# Patient Record
Sex: Male | Born: 1975 | Race: Black or African American | Hispanic: No | Marital: Single | Smoking: Former smoker
Health system: Southern US, Community
[De-identification: ages and names within clinical notes are randomized; demographics above are authoritative.]

## PROBLEM LIST (undated history)

## (undated) ENCOUNTER — Emergency Department (HOSPITAL_COMMUNITY): Admission: EM | Discharge status: Against Medical Advice | Payer: Self-pay

## (undated) DIAGNOSIS — F319 Bipolar disorder, unspecified: Secondary | ICD-10-CM

## (undated) DIAGNOSIS — Z789 Other specified health status: Secondary | ICD-10-CM

## (undated) DIAGNOSIS — F4310 Post-traumatic stress disorder, unspecified: Secondary | ICD-10-CM

## (undated) HISTORY — PX: NO PAST SURGERIES: SHX2092

---

## 2004-04-19 ENCOUNTER — Emergency Department (HOSPITAL_COMMUNITY): Admission: EM | Admit: 2004-04-19 | Discharge: 2004-04-19 | Payer: Self-pay | Admitting: Emergency Medicine

## 2005-01-28 ENCOUNTER — Emergency Department (HOSPITAL_COMMUNITY): Admission: EM | Admit: 2005-01-28 | Discharge: 2005-01-28 | Payer: Self-pay | Admitting: Family Medicine

## 2011-11-12 ENCOUNTER — Emergency Department (HOSPITAL_COMMUNITY)
Admission: EM | Admit: 2011-11-12 | Discharge: 2011-11-12 | Disposition: A | Discharge status: Home / Self Care | Payer: Self-pay | Attending: Emergency Medicine | Admitting: Emergency Medicine

## 2011-11-12 ENCOUNTER — Encounter (HOSPITAL_COMMUNITY): Payer: Self-pay | Admitting: *Deleted

## 2011-11-12 ENCOUNTER — Emergency Department (HOSPITAL_COMMUNITY): Payer: Self-pay

## 2011-11-12 DIAGNOSIS — W2209XA Striking against other stationary object, initial encounter: Secondary | ICD-10-CM | POA: Insufficient documentation

## 2011-11-12 DIAGNOSIS — S62309A Unspecified fracture of unspecified metacarpal bone, initial encounter for closed fracture: Secondary | ICD-10-CM | POA: Insufficient documentation

## 2011-11-12 DIAGNOSIS — F17200 Nicotine dependence, unspecified, uncomplicated: Secondary | ICD-10-CM | POA: Insufficient documentation

## 2011-11-12 DIAGNOSIS — S62339A Displaced fracture of neck of unspecified metacarpal bone, initial encounter for closed fracture: Secondary | ICD-10-CM

## 2011-11-12 MED ORDER — IBUPROFEN 400 MG PO TABS
800.0000 mg | ORAL_TABLET | Freq: Once | ORAL | Status: AC
  Administered 2011-11-12: 800 mg via ORAL
  Filled 2011-11-12: qty 2

## 2011-11-12 MED ORDER — HYDROCODONE-ACETAMINOPHEN 5-325 MG PO TABS
2.0000 | ORAL_TABLET | Freq: Once | ORAL | Status: AC
  Administered 2011-11-12: 2 via ORAL
  Filled 2011-11-12: qty 2

## 2011-11-12 MED ORDER — HYDROCODONE-ACETAMINOPHEN 5-325 MG PO TABS: ORAL_TABLET | ORAL | Status: AC

## 2011-11-12 NOTE — ED Notes (Signed)
Reports punching object on Friday, having pain and swelling to right hand.

## 2011-11-12 NOTE — ED Notes (Signed)
Pt presents to department for evaluation of R hand pain and swelling. States pain started on Friday after punching an object with his hand. Swelling and redness noted to dorsal area of hand. Pulses present. Able to wiggle digits. Sensation intact. 5/10 pain at the time. Pt is alert and oriented x4. No signs of acute distress noted.

## 2011-11-12 NOTE — Progress Notes (Signed)
Orthopedic Tech Progress Note Patient Details:  Dylan [name] April 15, 1976 [id number]  Ortho Devices Type of Ortho Device: Ulna gutter splint;Arm foam sling Ortho Device/Splint Interventions: Application   Cammer, Mickie Bail 11/12/2011, 3:12 PM

## 2011-11-12 NOTE — Discharge Instructions (Signed)
Boxer's Fracture You have a break (fracture) of the fifth metacarpal bone. This is commonly called a boxer's fracture. This is the bone in the hand where the little finger attaches. The fracture is in the end of that bone, closest to the little finger. It is usually caused when you hit an object with a clenched fist. Often, the knuckle is pushed down by the impact. Sometimes, the fracture rotates out of position. A boxer's fracture will usually heal within 6 weeks, if it is treated properly and protected from re-injury. Surgery is sometimes needed. A cast, splint, or bulky hand dressing may be used to protect and immobilize a boxer's fracture. Do not remove this device or dressing until your caregiver approves. Keep your hand elevated, and apply ice packs for 15 to 20 minutes every 2 hours, for the first 2 days. Elevation and ice help reduce swelling and relieve pain. See your caregiver, or an orthopedic specialist, for follow-up care within the next 10 days. This is to make sure your fracture is healing properly. Document Released: 05/01/2005 Document Revised: 04/20/2011 Document Reviewed: 10/19/2006 [facility] Patient Information 2012 [place], [place].     Narcotic and benzodiazepine use may cause drowsiness, slowed breathing or dependence.  Please use with caution and do not drive, operate machinery or watch young children alone while taking them.  Taking combinations of these medications or drinking alcohol will potentiate these effects.

## 2011-11-12 NOTE — ED Provider Notes (Signed)
History   This chart was scribed for Dylan [name]. Venora Kautzman, MD scribed by Magnus Sinning. The patient was seen in room TR06C/TR06C seen at 13:49   CSN: [id number]  Arrival date & time 11/12/11  1314   None     Chief Complaint  Patient presents with  . Hand Injury    (Consider location/radiation/quality/duration/timing/severity/associated sxs/prior treatment) HPI DARVIS CROFT is a 36 y.o. male who presents to the Emergency Department complaining of constant moderate right hand pain with associated swelling, onset 2 days after punching a refrigerator. States he has applied ice with no relief. Denies hx of previous fx's or any visits with orthopedic specialist. Pt is right handed.  No bleeding, laceration or abrasions.   History reviewed. No pertinent past medical history.  History reviewed. No pertinent past surgical history.  History reviewed. No pertinent family history.  History  Substance Use Topics  . Smoking status: Current Some Day Smoker    Types: Cigarettes  . Smokeless tobacco: Not on file  . Alcohol Use: No     Review of Systems  Constitutional: Negative.   Musculoskeletal: Positive for joint swelling.       Right hand pain  Skin: Negative for color change, rash and wound.  Neurological: Negative for weakness and numbness.    Allergies  Review of patient's allergies indicates no known allergies.  Home Medications   Current Outpatient Rx  Name Route Sig Dispense Refill  . ACETAMINOPHEN 325 MG PO TABS Oral Take 975 mg by mouth every 6 (six) hours as needed. For pain    . HYDROCODONE-ACETAMINOPHEN 5-325 MG PO TABS  1-2 tablets po q 6 hours prn moderate to severe pain 20 tablet 0    BP 112/69  Pulse 90  Temp 98.4 F (36.9 C) (Oral)  Resp 19  SpO2 98%  Physical Exam  Nursing note and vitals reviewed. Constitutional: He is oriented to person, place, and time. He appears well-developed and well-nourished. No distress.  HENT:  Head: Normocephalic and  atraumatic.  Neck: No tracheal deviation present.  Cardiovascular: Normal rate.   Pulmonary/Chest: Effort normal. No respiratory distress.  Musculoskeletal: Normal range of motion. He exhibits edema and tenderness.       2 + radial pulse. Good ROM of right elbow and shoulder. Wrist supple. No snuff box tenderness. Full ROM of all fingers except, 5th.  Tender at 5th metacarpal bone at MCP joint and obv swelling. Cap refill normal and no abrasions or lacerations.    Neurological: He is alert and oriented to person, place, and time.  Skin: Skin is warm and dry.  Psychiatric: He has a normal mood and affect. His behavior is normal.    ED Course  Procedures (including critical care time) DIAGNOSTIC STUDIES: Oxygen Saturation is 98% on room air, normal by my interpretation.    COORDINATION OF CARE:  Labs Reviewed - No data to display No results found.   1. Boxer's fracture     2:29 PM I reviewed and interpret hand xray to show a comminuted mildly displaced boxers fracture of 5th metacarpal bone on right.  Will place in ulnar gutter splint and refer to hand surgeon for follow up.  Dr. Izora Ribas.   MDM   I personally performed the services described in this documentation, which was scribed in my presence. The recorded information has been reviewed and considered.        Dylan [name]. Sonnie Bias, MD 11/12/11 1433

## 2011-11-12 NOTE — ED Notes (Signed)
Pt discharged home, had no further questions. 

## 2013-06-16 IMAGING — CR DG HAND COMPLETE 3+V*R*
3 series · 3 of 3 positions shown · non-contrast
Comparison: None

CLINICAL DATA: Hand injury

RIGHT HAND - COMPLETE 3+ VIEW

[x hand pa right]
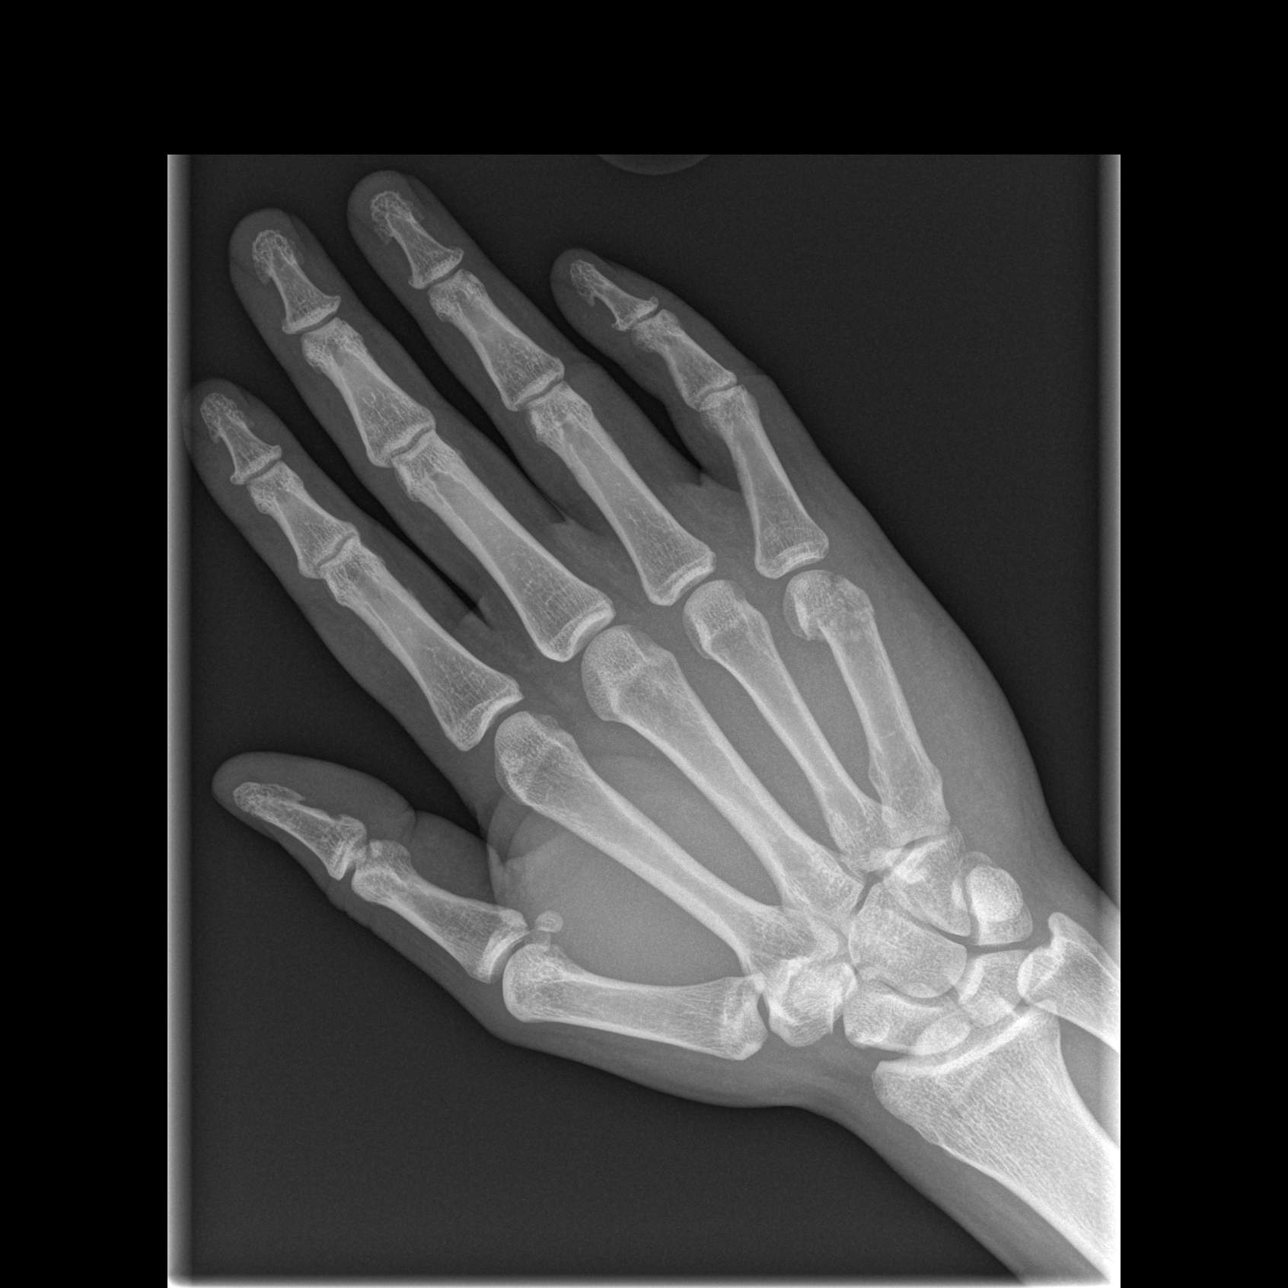

[x hand oblique right]
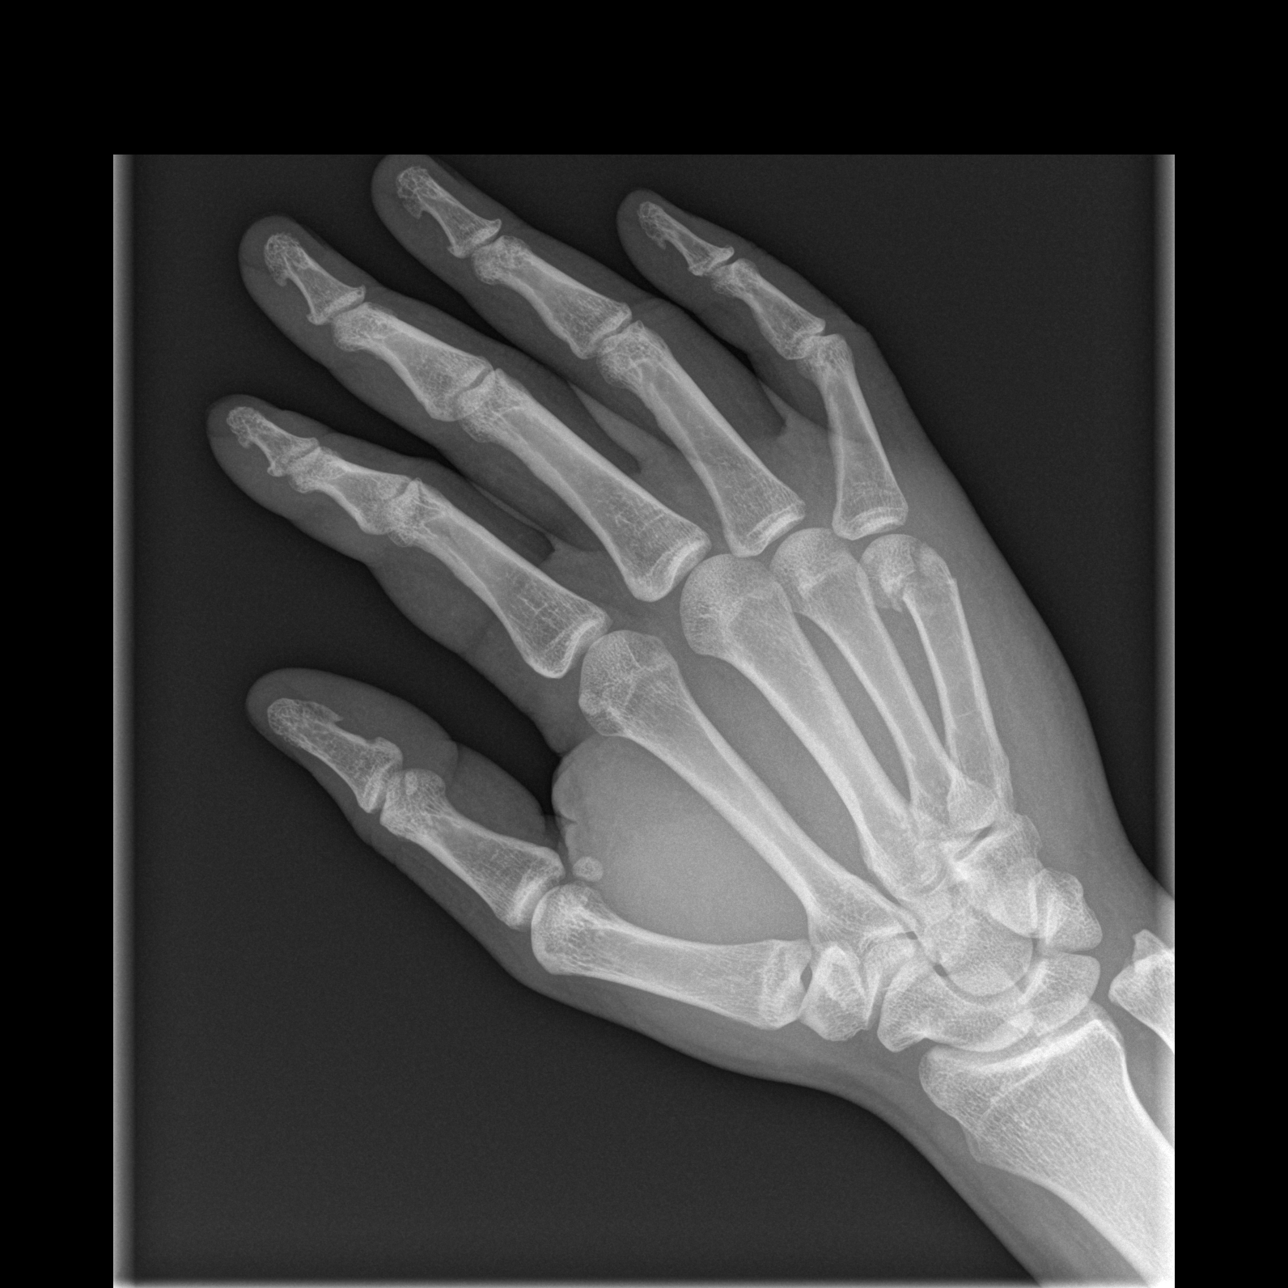

[x hand lat right]
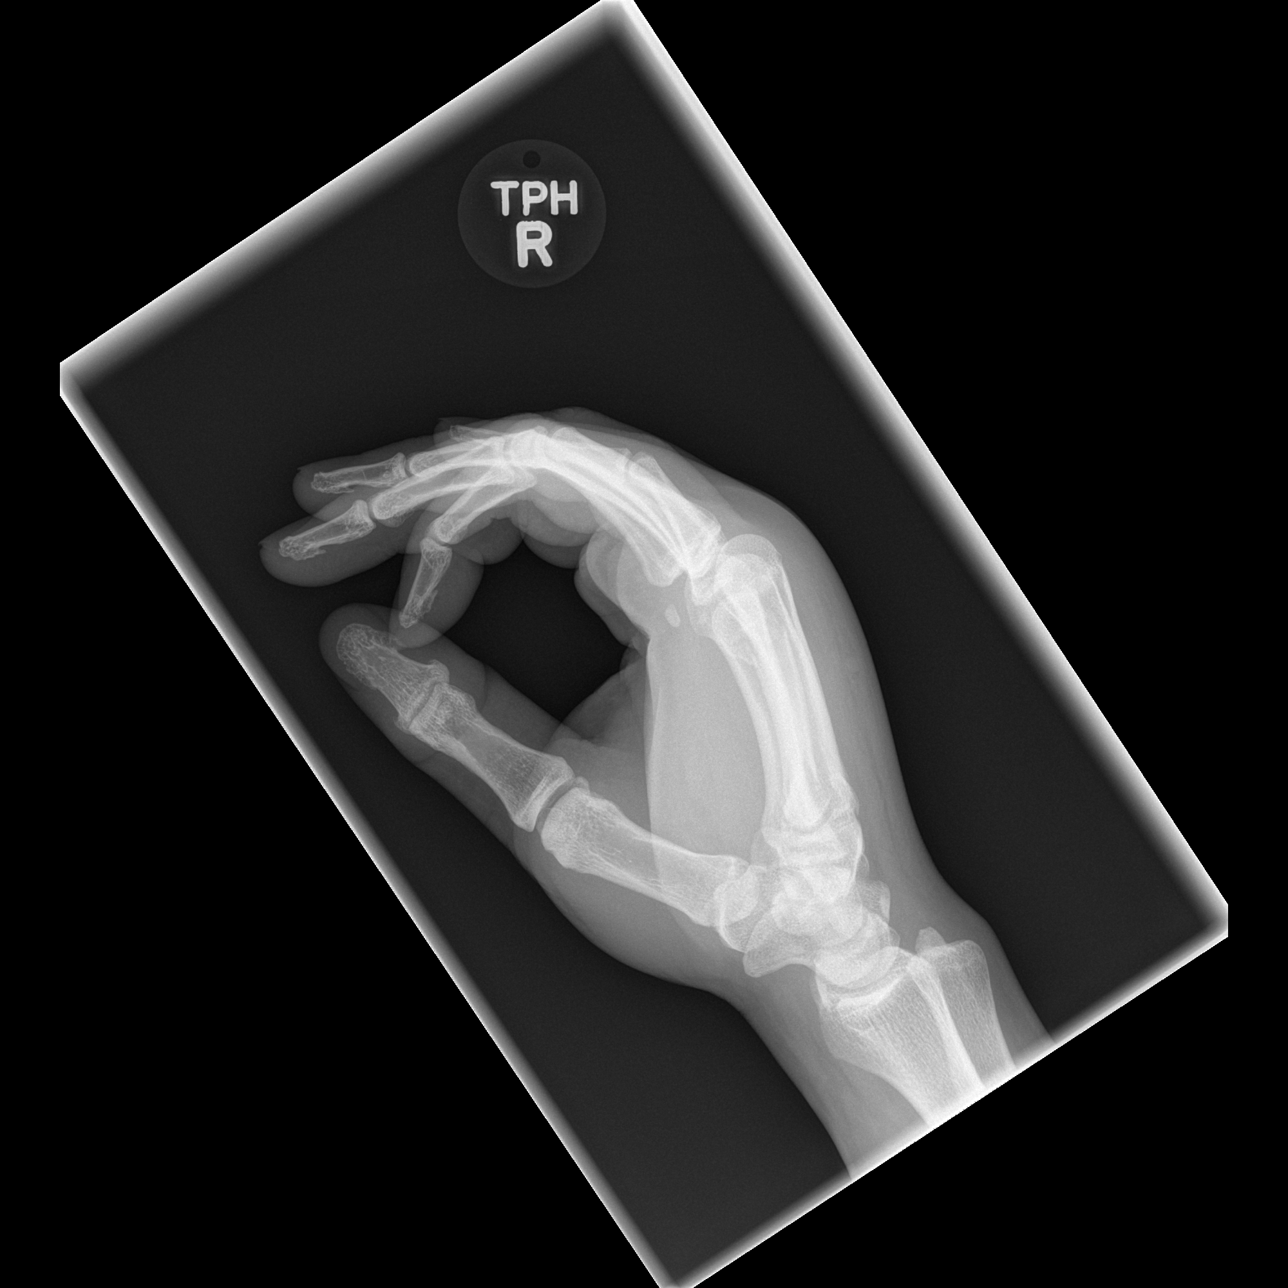

[3 of 3 positions shown; findings below may reference images not displayed]

FINDINGS: There is a fracture deformity involving the distal aspect
of the fifth metacarpal bone.  There is mild volar angulation of
the distal fracture fragments.
IMPRESSION: 1.  Exam is positive for acute boxer's fracture.

## 2014-02-12 ENCOUNTER — Encounter (HOSPITAL_COMMUNITY): Payer: Self-pay | Admitting: Emergency Medicine

## 2014-02-12 ENCOUNTER — Emergency Department (HOSPITAL_COMMUNITY)
Admission: EM | Admit: 2014-02-12 | Discharge: 2014-02-12 | Disposition: A | Discharge status: Home / Self Care | Payer: Self-pay | Attending: Emergency Medicine | Admitting: Emergency Medicine

## 2014-02-12 DIAGNOSIS — Z720 Tobacco use: Secondary | ICD-10-CM | POA: Insufficient documentation

## 2014-02-12 DIAGNOSIS — R55 Syncope and collapse: Secondary | ICD-10-CM | POA: Insufficient documentation

## 2014-02-12 DIAGNOSIS — Z8659 Personal history of other mental and behavioral disorders: Secondary | ICD-10-CM | POA: Insufficient documentation

## 2014-02-12 HISTORY — DX: Bipolar disorder, unspecified: F31.9

## 2014-02-12 HISTORY — DX: Post-traumatic stress disorder, unspecified: F43.10

## 2014-02-12 LAB — CBC WITH DIFFERENTIAL/PLATELET
Basophils Absolute: 0 10*3/uL (ref 0.0–0.1)
Basophils Relative: 0 % (ref 0–1)
EOS ABS: 0 10*3/uL (ref 0.0–0.7)
EOS PCT: 0 % (ref 0–5)
HEMATOCRIT: 38.8 % — AB (ref 39.0–52.0)
Hemoglobin: 13.1 g/dL (ref 13.0–17.0)
LYMPHS ABS: 1.4 10*3/uL (ref 0.7–4.0)
Lymphocytes Relative: 30 % (ref 12–46)
MCH: 31.6 pg (ref 26.0–34.0)
MCHC: 33.8 g/dL (ref 30.0–36.0)
MCV: 93.7 fL (ref 78.0–100.0)
MONO ABS: 0.4 10*3/uL (ref 0.1–1.0)
Monocytes Relative: 8 % (ref 3–12)
Neutro Abs: 3 10*3/uL (ref 1.7–7.7)
Neutrophils Relative %: 62 % (ref 43–77)
PLATELETS: 265 10*3/uL (ref 150–400)
RBC: 4.14 MIL/uL — AB (ref 4.22–5.81)
RDW: 12.6 % (ref 11.5–15.5)
WBC: 4.8 10*3/uL (ref 4.0–10.5)

## 2014-02-12 LAB — BASIC METABOLIC PANEL
ANION GAP: 9 (ref 5–15)
BUN: 11 mg/dL (ref 6–23)
CO2: 29 meq/L (ref 19–32)
Calcium: 9 mg/dL (ref 8.4–10.5)
Chloride: 108 mEq/L (ref 96–112)
Creatinine, Ser: 0.87 mg/dL (ref 0.50–1.35)
GFR calc non Af Amer: >90 mL/min (ref >90)
Glucose, Bld: 97 mg/dL (ref 70–99)
Potassium: 4 mEq/L (ref 3.7–5.3)
SODIUM: 146 meq/L (ref 137–147)

## 2014-02-12 LAB — I-STAT TROPONIN, ED: TROPONIN I, POC: 0 ng/mL (ref 0.00–0.08)

## 2014-02-12 LAB — CBG MONITORING, ED: Glucose-Capillary: 94 mg/dL (ref 70–99)

## 2014-02-12 MED ORDER — SODIUM CHLORIDE 0.9 % IV BOLUS (SEPSIS)
1000.0000 mL | Freq: Once | INTRAVENOUS | Status: AC
Start: 2014-02-12 — End: 2014-02-12
  Administered 2014-02-12: 1000 mL via INTRAVENOUS

## 2014-02-12 NOTE — Discharge Instructions (Signed)
Please follow the directions provided.  It is important to establish care with a primary care provider to follow your symptoms.  You may use the referral listed or the resources below to find a doctor.  Please eat and drink regularly.  Feel free to return if you have any worsening or concerning symptoms.    SEEK IMMEDIATE MEDICAL CARE IF:  You have a severe headache.  You have unusual pain in the chest, abdomen, or back.  You are bleeding from your mouth or rectum, or you have black or tarry stool.  You have an irregular or very fast heartbeat.  You have pain with breathing.  You have repeated fainting or seizure-like jerking during an episode.  You faint when sitting or lying down.  You have confusion.  You have trouble walking.  You have severe weakness.  You have vision problems. If you fainted, call your local emergency services (911 in [place] Do not drive yourself to the hospital.    Emergency Department Resource Guide 1) Find a Doctor and Pay Out of Pocket Although you won't have to find out who is covered by your insurance plan, it is a good idea to ask around and get recommendations. You will then need to call the office and see if the doctor you have chosen will accept you as a new patient and what types of options they offer for patients who are self-pay. Some doctors offer discounts or will set up payment plans for their patients who do not have insurance, but you will need to ask so you aren't surprised when you get to your appointment.  2) Contact Your Local Health Department Not all health departments have doctors that can see patients for sick visits, but many do, so it is worth a call to see if yours does. If you don't know where your local health department is, you can check in your phone book. The CDC also has a tool to help you locate your state's health department, and many state websites also have listings of all of their local health departments.  3) Find a Walk-in  Clinic If your illness is not likely to be very severe or complicated, you may want to try a walk in clinic. These are popping up all over the country in pharmacies, drugstores, and shopping centers. They're usually staffed by nurse practitioners or physician assistants that have been trained to treat common illnesses and complaints. They're usually fairly quick and inexpensive. However, if you have serious medical issues or chronic medical problems, these are probably not your best option.  No Primary Care Doctor: - Call Health Connect at  [phone] - they can help you locate a primary care doctor that  accepts your insurance, provides certain services, etc. - Physician Referral Service- [phone]  Chronic Pain Problems: Organization         Address  Phone   Notes  [facility]  [phone] Patients need to be referred by their primary care doctor.   Medication Assistance: Organization         Address  Phone   Notes  [facility] Medication [facility] [address] [place], [place] [address] [phone] --Must be a resident of [facility] -- Must have NO insurance coverage whatsoever (no Medicaid/ Medicare, etc.) -- The pt. MUST have a primary care doctor that directs their care regularly and follows them in the community   Med[facility]ist  [phone]   [address]  (  [phone]) [phone]    Agencies that provide inexpensive medical care: Organization         Address  Phone   Notes  [facility]  [phone](336) [phone]   [facility]    [phone]) [phone]   [facility] [address] [place], [place] [address] [phone]) [phone]   [facility] [address], [place] [phone]) [phone]   [facility]    [phone](336) [phone]   [facility]    [phone](336) [phone]   [facility] and [facility]  [address], [place] Phone:  [phone](336) [phone], Fax:  [phone] Hours  of Operation:  9 am - 6 pm, M-F.  Also accepts Medicaid/Medicare and self-pay.  [facility]  [address], [place] Phone: [phone](336) [phone], Fax: [phone](336) [phone]. Hours of Operation:  8:30 am - 5:30 pm, M-F.  Also accepts Medicaid and self-pay.  [facility] [place] [address], [place] Phone: [phone](336) [phone]   [facility] [address], [place], [place] [phone], Ext. [phone] Mondays & Thursdays: 7-9 AM.  First 15 patients are seen on a first come, first serve basis.    Medicaid-accepting [facility] Providers:  Organization         Address  Phone   Notes  [facility] [address], [place] [phone]) [phone] Also accepts self-pay patients.  [facility] [address], [place]  [phone]) [phone]   [facility] [address], [place] [phone]) [phone]   [facility] [address] [place] [address], [place] [phone]) [phone]   Renaye RakersVeita Bland [address], [place]   [phone] Only accepts [place] Access [place]icaid patients after they have their name applied to their card.   Self-Pay (no insurance) in [facility]:  Organization         Address  Phone   Notes  Sickle Cell Patients, [facility] [address], [place] [phone]) [phone]   [facility] [address], [place] [phone]) [phone]   [facility] [place]  [address], [place] [phone]) [phone]   [facility]/Dr. Osei-Bonsu  [address] [place] [address], [place] or [id number], [place] [phone]) [phone] Phone number for both [place] and [place] locations is the same.  [facility] [address], [place] [phone]) [phone]   [facility] [place] [address] [place] [address], [place] or [address] [phone]) [phone]   [facility] [address], [place] [phone]) [phone], phone; [phone]) [phone], fax Sees patients 1st and 3rd Saturday of every month.  Must not qualify for public or private insurance (i.e. Medicaid, Medicare, [place] Health Choice, Veterans' Benefits)  Household income should be no more than 200% of the poverty level The clinic cannot treat you if you are pregnant or think you are pregnant  Sexually transmitted diseases are not treated at the clinic.    Dental Care: Organization         Address  Phone  Notes  [facility] Department of [facility] [address], [place] [phone] Accepts children up to age 37 who are enrolled in [place]icaid or [place] Health Choice; pregnant women with a Medicaid card; and children who  have applied for Medicaid or [place] Health Choice, but were declined, whose parents can pay a reduced fee at time of service.  [facility] of [facility] [place]  [address], [place] [phone]) [phone] Accepts children up to age 37 who are enrolled in [place]icaid or [place] Health Choice; pregnant women with a Medicaid card; and children who have applied for Medicaid or [place] Health Choice, but were declined, whose parents can pay a reduced fee at time of service.  [facility]  [address], [place] [phone]) [phone] Patients are seen by appointment only. Walk-ins are not accepted. [facility] will see patients 37 years of age and older. Monday - Tuesday (8am-5pm) Most Wednesdays (8:30-5pm) $30 per visit, cash only  [facility]  [address], [facility] [phone] Patients are seen by appointment only. Walk-ins are not accepted. [facility] will see patients 37 years of age and older. One Wednesday Evening (Monthly: Volunteer Based).  $30 per visit, cash only  [facility]  [phone] for adults; Children under age 634, call Graduate  Pediatric Dentistry at [phone]) [phone]. Children aged 594-14, please call [phone] to request a pediatric application.  Dental services are provided in all areas of dental care including fillings, crowns and bridges, complete and partial dentures, implants, gum treatment, root canals, and extractions. Preventive care is also provided. Treatment is provided to both adults and children. Patients are selected via a lottery and there is often a waiting list.   [facility] [address], [place]  [phone]) [phone] [email or web address]   [facility] [address], [place], [place] [phone], Ext. [phone] Second and Fourth Thursday of each month, opens at 6:30 AM; Clinic ends at 9 AM.  Patients are seen on a first-come first-served basis, and a limited number are seen during each clinic.   [facility]  [name], [place], [place] [phone](336) [phone]   Eligibility Requirements You must have lived in [place], [place], or [place] counties for at least the last three months.   You cannot be eligible for state or federal sponsored National Cityhealthcare insurance, including CIGNAVeterans Administration, [place]icaid, or Harrah[employer] EntertainmentMedicare.   You generally cannot be eligible for healthcare insurance through your employer.    How to apply: Eligibility screenings are held every Tuesday and Wednesday afternoon from 1:00 pm until 4:00 pm. You do not need an appointment for the interview!  [facility] [address], [place], [place] [phone]   [facility]  [phone]   [facility]  [phone]   [facility]  [phone]    Behavioral Health Resources in the Community: Intensive Outpatient Programs Organization         Address  Phone  Notes  [facility] [address], [place], [place] [phone]   [facility] [address], [place], [place]  [phone]   [facility] [address], [place], [place]  [phone]   [facility] [address],  [place], [place] [phone] or [phone]   Substance Abuse Resources Organization         Address  Phone  Notes  [facility] Drug Services  [phone]   [facility]  [phone]   [facility]  [phone]   [facility]  [phone]   Residential & Outpatient  Substance Abuse Program  [phone]   Psychological Services Organization         Address  Phone  Notes  [facility]  [phone]- [phone]   [facility]  [phone]- [phone]   [facility] [phone] N[address], [place] [phone] or [phone]    Mobile Crisis Teams Organization         Address  Phone  Notes  [facility], Mobile Crisis Care Unit  [phone]   [facility]  [address] [place], [place] [phone]   Doristine LocksSharon DeEsch [address] [place] [phone]    Self-Help/Support Groups Organization         Address  Phone             Notes  [facility]ental Health Assoc. of [place] - variety of support groups  336- [phone] Call for more information  Narcotics Anonymous (NA), [facility] [address], [place]  2 meetings at this location   Statisticianesidential Treatment Programs Organization         Address  Phone  Notes  [facility] [address],    [place]  [phone]   [facility]  [address], [place] [phone], [place], [place] [phone]   [facility] [address], [place] [phone] Admissions: 8am-3pm M-F  [facility] [address]    [place], [place] [phone]   [facility] [address], [place], [place] [phone]   [facility] [address]  [place], [place] [phone]   [facility] - Intensive Outpatient [address], [place], [place] [phone]   [facility] ([facility] [address]  [place], [place] [phone] or [phone]   [facility] ([facility]) [address] [place], [place] [phone] Accepts Medicaid  [facility] [address]  [place] [phone] Substance Abuse/Addiction Treatment   [facility] Behavioral Health Resources Organization         Address  Phone  Notes  [facility]  [phone]) [phone]   Angie FavaJulie Brannon, PhD [address], [address] [place], [place]   [phone](336) [phone] or [phone](336) [phone]   [facility] [place]   [address] [place], [place] [phone]) [phone]   [facility] [address], [place], [place] [phone]) [phone] Insurance/Medicaid/sponsorship through [facility]  Faith and Families [address]                                    [place], [place] [phone] Therapy/tele-psych/case  [facility] [address]   [place], [place] [phone](336) [phone]    Dr. Lolly MustacheArfeen  [phone](336) [phone]   [facility]  [employer] [facility] 1) [address], [place] 2) [address], [place] 3)  [address], [place] [phone]) [phone](336) [phone]  [phone](336) [phone]   [facility] Child Abuse Hotline [phone](336) [phone] or [phone](336) [phone] (After Hours)

## 2014-02-12 NOTE — ED Notes (Signed)
Pt. Given a [place] sandwich bag with 2 8 ounces of water.

## 2014-02-12 NOTE — ED Notes (Signed)
Pt to ED via [facility] c/o near syncopal episode at work. Co-workers reported that patient was standing at a stack on pallets when he felt lightheaded and slid down to the floor. -LOC. Hx of PTSD and bipolar. Pt tearful with EMS. Denies pain at this time

## 2014-02-12 NOTE — ED Provider Notes (Signed)
CSN: [id number]     Arrival date & time 02/12/14  1600 History   First MD Initiated Contact with Patient 02/12/14 1603     Chief Complaint  Patient presents with  . Near Syncope   (Consider location/radiation/quality/duration/timing/severity/associated sxs/prior Treatment) HPI Dylan [name] is a 38 yo male presenting with report light-headedness today at work. He reports feeling well this am but while at work he was standing on some pallets and began to feel lightheaded.  He leaned up against the wall and and slid down.  Co-workers report he was unconscious x several minutes.  There was no mention of seizure activity.    He reports not eating or drinking today because he was in a hurry to get to work on time. He denies any head injury before or after, any chest pain, or shortness of breath at any time. He denies any recent illness including any nausea, vomiting, diarrhea, fever, headaches, or  blurry vision.   Past Medical History  Diagnosis Date  . PTSD (post-traumatic stress disorder)   . Bipolar 1 disorder    History reviewed. No pertinent past surgical history. No family history on file. History  Substance Use Topics  . Smoking status: Current Some Day Smoker    Types: Cigarettes  . Smokeless tobacco: Not on file  . Alcohol Use: Yes    Review of Systems  Constitutional: Negative for fever and chills.  HENT: Negative for sore throat.   Eyes: Negative for visual disturbance.  Respiratory: Negative for cough and shortness of breath.   Cardiovascular: Negative for chest pain and leg swelling.  Gastrointestinal: Negative for nausea, vomiting and diarrhea.  Genitourinary: Negative for dysuria.  Musculoskeletal: Negative for myalgias.  Skin: Negative for rash.  Neurological: Positive for syncope and light-headedness. Negative for weakness, numbness and headaches.    Allergies  Review of patient's allergies indicates no known allergies.  Home Medications   Prior to Admission  medications   Medication Sig Start Date End Date Taking? Authorizing Provider  acetaminophen (TYLENOL) 325 MG tablet Take 975 mg by mouth every 6 (six) hours as needed. For pain    Historical Provider, MD   BP 110/67  Pulse 76  Temp(Src) 98.3 F (36.8 C) (Oral)  Resp 26  SpO2 100% Physical Exam  Nursing note and vitals reviewed. Constitutional: He is oriented to person, place, and time. He appears well-developed and well-nourished. No distress.  HENT:  Head: Normocephalic and atraumatic.  Mouth/Throat: Mucous membranes are dry. No oropharyngeal exudate.  Eyes: Conjunctivae are normal. Pupils are equal, round, and reactive to light. No scleral icterus.  Neck: Neck supple. No thyromegaly present.  Cardiovascular: Normal rate, regular rhythm and intact distal pulses.   Pulmonary/Chest: Effort normal and breath sounds normal. No respiratory distress. He has no wheezes. He has no rales. He exhibits no tenderness.  Abdominal: Soft. There is no tenderness.  Musculoskeletal: He exhibits no tenderness.  Lymphadenopathy:    He has no cervical adenopathy.  Neurological: He is alert and oriented to person, place, and time. No cranial nerve deficit. Coordination normal.  Skin: Skin is warm and dry. No rash noted. He is not diaphoretic.  Psychiatric: He has a normal mood and affect.    ED Course  Procedures (including critical care time) Labs Review Labs Reviewed  CBC WITH DIFFERENTIAL - Abnormal; Notable for the following:    RBC 4.14 (*)    HCT 38.8 (*)    All other components within normal limits  BASIC METABOLIC  PANEL  I-STAT TROPOININ, ED  POCT CBG (FASTING - GLUCOSE)-MANUAL ENTRY  CBG MONITORING, ED  POCT CBG (FASTING - GLUCOSE)-MANUAL ENTRY  I-STAT TROPOININ, ED   Imaging Review No results found.   EKG Interpretation   Date/Time:  Thursday February 12 2014 16:15:53 EDT Ventricular Rate:  66 PR Interval:  176 QRS Duration: 106 QT Interval:  382 QTC Calculation: 400 R  Axis:   55 Text Interpretation:  Sinus rhythm Ventricular premature complex RSR' in  V1 or V2, right VCD or RVH Non-specific ST-t changes No previous tracing  Confirmed by POLLINA  MD, CHRISTOPHER [phone]) on 02/12/2014 5:39:57 PM      MDM   Final diagnoses:  Syncope, unspecified syncope type   38 yo male presenting with syncopal epidsode while at work today.  CBC, CMP, troponin without significant abnormality. EKG is sinus rhyhm with PVCs, but no previous EKG for comparison. No indication of WPW.  NS bolus given, pt fed and monitored.  Pt appears well and reports ready for discharge.  Vital signs stable and pt able to ambulate in the ED without difficulty.  Discharge instructions include good nutrition and hydration, resources to establish care for follow-up with PCP.  Return precautions provided.   Filed Vitals:   02/12/14 1830 02/12/14 1845 02/12/14 1900 02/12/14 1930  BP: 112/66 110/61 110/60 109/67  Pulse: 63 57 61 60  Temp:      TempSrc:      Resp: 22 17 14 21   SpO2: 100% 100% 100% 100%   Meds given in ED:  Medications  sodium chloride 0.9 % bolus 1,000 mL (0 mLs Intravenous Stopped 02/12/14 1807)    Discharge Medication List as of 02/12/2014  7:38 PM       Harle BattiestElizabeth Suhan Paci, NP 02/16/14 2317

## 2014-02-17 NOTE — ED Provider Notes (Signed)
Medical screening examination/treatment/procedure(s) were conducted as a shared visit with non-physician practitioner(s) and myself.  I personally evaluated the patient during the encounter.   EKG Interpretation   Date/Time:  Thursday February 12 2014 16:15:53 EDT Ventricular Rate:  66 PR Interval:  176 QRS Duration: 106 QT Interval:  382 QTC Calculation: 400 R Axis:   55 Text Interpretation:  Sinus rhythm Ventricular premature complex RSR' in  V1 or V2, right VCD or RVH Non-specific ST-t changes No previous tracing  Confirmed by Cayde Held  MD, Tracey Stewart [phone]) on 02/12/2014 5:39:57 PM      Patient presents to the ER for evaluation of acute onset of dizziness syncope. Patient reports onset of feeling of dizziness and he was going to pass out prior to the event. He slid down to the floor, there was no fall. Workup including cardiac workup with 2 troponins all negative. Patient appropriate for discharge and outpatient followup.  Gilda Creasehristopher J. Thea Holshouser, MD 02/17/14 [phone]

## 2014-09-16 ENCOUNTER — Emergency Department (HOSPITAL_COMMUNITY)
Admission: EM | Admit: 2014-09-16 | Discharge: 2014-09-16 | Disposition: A | Discharge status: Home / Self Care | Payer: Self-pay | Attending: Emergency Medicine | Admitting: Emergency Medicine

## 2014-09-16 ENCOUNTER — Encounter (HOSPITAL_COMMUNITY): Payer: Self-pay

## 2014-09-16 DIAGNOSIS — R42 Dizziness and giddiness: Secondary | ICD-10-CM

## 2014-09-16 DIAGNOSIS — Z87891 Personal history of nicotine dependence: Secondary | ICD-10-CM | POA: Insufficient documentation

## 2014-09-16 DIAGNOSIS — G43009 Migraine without aura, not intractable, without status migrainosus: Secondary | ICD-10-CM

## 2014-09-16 DIAGNOSIS — Z79899 Other long term (current) drug therapy: Secondary | ICD-10-CM | POA: Insufficient documentation

## 2014-09-16 DIAGNOSIS — F4310 Post-traumatic stress disorder, unspecified: Secondary | ICD-10-CM | POA: Insufficient documentation

## 2014-09-16 DIAGNOSIS — F319 Bipolar disorder, unspecified: Secondary | ICD-10-CM | POA: Insufficient documentation

## 2014-09-16 DIAGNOSIS — G43909 Migraine, unspecified, not intractable, without status migrainosus: Secondary | ICD-10-CM | POA: Insufficient documentation

## 2014-09-16 LAB — I-STAT CHEM 8, ED
BUN: 18 mg/dL (ref 6–20)
CALCIUM ION: 1.22 mmol/L (ref 1.12–1.23)
CREATININE: 1 mg/dL (ref 0.61–1.24)
Chloride: 100 mmol/L — ABNORMAL LOW (ref 101–111)
GLUCOSE: 77 mg/dL (ref 70–99)
HCT: 44 % (ref 39.0–52.0)
Hemoglobin: 15 g/dL (ref 13.0–17.0)
Potassium: 3.5 mmol/L (ref 3.5–5.1)
SODIUM: 141 mmol/L (ref 135–145)
TCO2: 27 mmol/L (ref 0–100)

## 2014-09-16 MED ORDER — METOCLOPRAMIDE HCL 5 MG/ML IJ SOLN
10.0000 mg | Freq: Once | INTRAMUSCULAR | Status: AC
  Administered 2014-09-16: 10 mg via INTRAVENOUS
  Filled 2014-09-16: qty 2

## 2014-09-16 MED ORDER — KETOROLAC TROMETHAMINE 30 MG/ML IJ SOLN
30.0000 mg | Freq: Once | INTRAMUSCULAR | Status: AC
  Administered 2014-09-16: 30 mg via INTRAVENOUS
  Filled 2014-09-16: qty 1

## 2014-09-16 MED ORDER — DIPHENHYDRAMINE HCL 50 MG/ML IJ SOLN
50.0000 mg | Freq: Once | INTRAMUSCULAR | Status: AC
  Administered 2014-09-16: 50 mg via INTRAVENOUS
  Filled 2014-09-16: qty 1

## 2014-09-16 MED ORDER — SODIUM CHLORIDE 0.9 % IV BOLUS (SEPSIS)
1000.0000 mL | Freq: Once | INTRAVENOUS | Status: AC
  Administered 2014-09-16: 1000 mL via INTRAVENOUS

## 2014-09-16 NOTE — Discharge Instructions (Signed)

## 2014-09-16 NOTE — ED Provider Notes (Signed)
TIME SEEN: 2:20 PM  CHIEF COMPLAINT: Migraine headache  HPI: Pt is a 39 y.o. male with history of PTSD, bipolar disorder, migraine headaches who presents to the pregnancy department with a diffuse throbbing migraine headache started while at work today. Reports he is also felt lightheaded today which has happened before with his migraine headaches. No vertigo. No numbness, tingling or focal weakness. No head injury. Not on anticoagulation. No fever, neck pain or neck stiffness. Reports normally he takes Advil PM at home for his headaches but has not taken any medication for it today. States that this headache feels exactly like his prior migraines.  ROS: See HPI Constitutional: no fever  Eyes: no drainage  ENT: no runny nose   Cardiovascular:  no chest pain  Resp: no SOB  GI: no vomiting GU: no dysuria Integumentary: no rash  Allergy: no hives  Musculoskeletal: no leg swelling  Neurological: no slurred speech ROS otherwise negative  PAST MEDICAL HISTORY/PAST SURGICAL HISTORY:  Past Medical History  Diagnosis Date  . PTSD (post-traumatic stress disorder)   . Bipolar 1 disorder     MEDICATIONS:  Prior to Admission medications   Medication Sig Start Date End Date Taking? Authorizing Provider  QUEtiapine Fumarate (SEROQUEL PO) Take 1 tablet by mouth daily.    Historical Provider, MD  sertraline (ZOLOFT) 100 MG tablet Take 100 mg by mouth daily.    Historical Provider, MD    ALLERGIES:  No Known Allergies  SOCIAL HISTORY:  History  Substance Use Topics  . Smoking status: Former Games developermoker  . Smokeless tobacco: Not on file  . Alcohol Use: Yes    FAMILY HISTORY: History reviewed. No pertinent family history.  EXAM: BP 115/69 mmHg  Pulse 86  Temp(Src) 97.9 F (36.6 C) (Oral)  SpO2 100% CONSTITUTIONAL: Alert and oriented and responds appropriately to questions. Well-appearing; well-nourished, nontoxic-appearing, in no distress HEAD: Normocephalic EYES: Conjunctivae clear,  PERRL ENT: normal nose; no rhinorrhea; moist mucous membranes; pharynx without lesions noted NECK: Supple, no meningismus, no LAD  CARD: RRR; S1 and S2 appreciated; no murmurs, no clicks, no rubs, no gallops RESP: Normal chest excursion without splinting or tachypnea; breath sounds clear and equal bilaterally; no wheezes, no rhonchi, no rales ABD/GI: Normal bowel sounds; non-distended; soft, non-tender, no rebound, no guarding BACK:  The back appears normal and is non-tender to palpation, there is no CVA tenderness EXT: Normal ROM in all joints; non-tender to palpation; no edema; normal capillary refill; no cyanosis    SKIN: Normal color for age and race; warm NEURO: Moves all extremities equally, sensation to light touch intact diffusely, cranial nerves II-12 intact PSYCH: The patient's mood and manner are appropriate. Grooming and personal hygiene are appropriate.  MEDICAL DECISION MAKING: Patient here with typical migraine headache with associated lightheadedness. Basic labs unremarkable. EKG shows no ischemic changes, arrhythmia or interval changes. Patient reports headache is gone after Toradol, Reglan, IV fluids and Benadryl. Lightheadedness also improved. Able to ambulate without difficulty. We'll discharge patient home with outpatient PCP follow-up. Discussed using Tylenol and ibuprofen as needed for headaches in the future. I'm not concerned for any life-threatening illness and do not feel he needs acute head imaging at this time.  Patient verbalizes understanding and is comfortable with this plan.      EKG Interpretation  Date/Time:  Wednesday Sep 16 2014 13:20:50 EDT Ventricular Rate:  88 PR Interval:  165 QRS Duration: 101 QT Interval:  390 QTC Calculation: 472 R Axis:   72 Text Interpretation:  Sinus rhythm Multiform ventricular premature complexes RSR' in V1 or V2, right VCD or RVH Nonspecific T abnormalities, lateral leads Confirmed by Catlynn Grondahl,  DO, Rayshad Riviello [phone] on 09/16/2014  1:49:55 PM          Layla MawKristen N Warrick Llera, DO 09/16/14 1556

## 2014-09-16 NOTE — ED Notes (Signed)
Pt c/o being lightheaded and headache while at work this afternoon.  Pain score 6/10.  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  Pt started new job yesterday.

## 2015-04-19 ENCOUNTER — Emergency Department (HOSPITAL_COMMUNITY)
Admission: EM | Admit: 2015-04-19 | Discharge: 2015-04-20 | Disposition: A | Discharge status: Other Acute Inpatient Hospital | Payer: Federal, State, Local not specified - Other | Attending: Emergency Medicine | Admitting: Emergency Medicine

## 2015-04-19 ENCOUNTER — Encounter (HOSPITAL_COMMUNITY): Payer: Self-pay | Admitting: Emergency Medicine

## 2015-04-19 DIAGNOSIS — R4689 Other symptoms and signs involving appearance and behavior: Secondary | ICD-10-CM

## 2015-04-19 DIAGNOSIS — Z79899 Other long term (current) drug therapy: Secondary | ICD-10-CM | POA: Insufficient documentation

## 2015-04-19 DIAGNOSIS — F4310 Post-traumatic stress disorder, unspecified: Secondary | ICD-10-CM | POA: Insufficient documentation

## 2015-04-19 DIAGNOSIS — F1210 Cannabis abuse, uncomplicated: Secondary | ICD-10-CM | POA: Insufficient documentation

## 2015-04-19 DIAGNOSIS — F1220 Cannabis dependence, uncomplicated: Secondary | ICD-10-CM | POA: Diagnosis present

## 2015-04-19 DIAGNOSIS — Z87891 Personal history of nicotine dependence: Secondary | ICD-10-CM | POA: Insufficient documentation

## 2015-04-19 DIAGNOSIS — F319 Bipolar disorder, unspecified: Secondary | ICD-10-CM | POA: Insufficient documentation

## 2015-04-19 DIAGNOSIS — F911 Conduct disorder, childhood-onset type: Secondary | ICD-10-CM | POA: Insufficient documentation

## 2015-04-19 LAB — RAPID URINE DRUG SCREEN, HOSP PERFORMED
Amphetamines: NOT DETECTED
BENZODIAZEPINES: NOT DETECTED
Barbiturates: NOT DETECTED
Cocaine: NOT DETECTED
OPIATES: NOT DETECTED
TETRAHYDROCANNABINOL: POSITIVE — AB

## 2015-04-19 LAB — CBC WITH DIFFERENTIAL/PLATELET
BASOS ABS: 0 10*3/uL (ref 0.0–0.1)
BASOS PCT: 0 %
EOS ABS: 0.1 10*3/uL (ref 0.0–0.7)
Eosinophils Relative: 1 %
HEMATOCRIT: 42.1 % (ref 39.0–52.0)
HEMOGLOBIN: 14.4 g/dL (ref 13.0–17.0)
Lymphocytes Relative: 25 %
Lymphs Abs: 1.8 10*3/uL (ref 0.7–4.0)
MCH: 32.4 pg (ref 26.0–34.0)
MCHC: 34.2 g/dL (ref 30.0–36.0)
MCV: 94.8 fL (ref 78.0–100.0)
Monocytes Absolute: 0.5 10*3/uL (ref 0.1–1.0)
Monocytes Relative: 7 %
NEUTROS ABS: 4.8 10*3/uL (ref 1.7–7.7)
NEUTROS PCT: 67 %
Platelets: 303 10*3/uL (ref 150–400)
RBC: 4.44 MIL/uL (ref 4.22–5.81)
RDW: 12.5 % (ref 11.5–15.5)
WBC: 7.1 10*3/uL (ref 4.0–10.5)

## 2015-04-19 LAB — COMPREHENSIVE METABOLIC PANEL
ALK PHOS: 57 U/L (ref 38–126)
ALT: 24 U/L (ref 17–63)
ANION GAP: 6 (ref 5–15)
AST: 20 U/L (ref 15–41)
Albumin: 4.3 g/dL (ref 3.5–5.0)
BILIRUBIN TOTAL: 0.9 mg/dL (ref 0.3–1.2)
BUN: 14 mg/dL (ref 6–20)
CALCIUM: 9.1 mg/dL (ref 8.9–10.3)
CO2: 28 mmol/L (ref 22–32)
Chloride: 103 mmol/L (ref 101–111)
Creatinine, Ser: 1.07 mg/dL (ref 0.61–1.24)
GFR calc non Af Amer: >60 mL/min (ref >60)
Glucose, Bld: 98 mg/dL (ref 65–99)
POTASSIUM: 4 mmol/L (ref 3.5–5.1)
Sodium: 137 mmol/L (ref 135–145)
TOTAL PROTEIN: 7.3 g/dL (ref 6.5–8.1)

## 2015-04-19 LAB — ETHANOL

## 2015-04-19 MED ORDER — HALOPERIDOL LACTATE 5 MG/ML IJ SOLN: 5.0000 mg | Freq: Four times a day (QID) | INTRAMUSCULAR | Status: DC | PRN

## 2015-04-19 MED ORDER — LORAZEPAM 1 MG PO TABS: 1.0000 mg | ORAL_TABLET | Freq: Four times a day (QID) | ORAL | Status: DC | PRN
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
## 2015-04-19 NOTE — ED Notes (Signed)
Pt eval by TTS Marcus at present. 
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
## 2015-04-20 DIAGNOSIS — F4310 Post-traumatic stress disorder, unspecified: Secondary | ICD-10-CM

## 2015-04-20 DIAGNOSIS — R45850 Homicidal ideations: Secondary | ICD-10-CM

## 2015-04-20 DIAGNOSIS — R4689 Other symptoms and signs involving appearance and behavior: Secondary | ICD-10-CM | POA: Insufficient documentation

## 2015-04-20 DIAGNOSIS — F1220 Cannabis dependence, uncomplicated: Secondary | ICD-10-CM | POA: Diagnosis not present

## 2015-04-20 DIAGNOSIS — F6089 Other specific personality disorders: Secondary | ICD-10-CM

## 2015-04-20 DIAGNOSIS — F319 Bipolar disorder, unspecified: Secondary | ICD-10-CM | POA: Diagnosis not present

## 2015-04-20 MED ORDER — SERTRALINE HCL 50 MG PO TABS
50.0000 mg | ORAL_TABLET | Freq: Every day | ORAL | Status: DC
  Administered 2015-04-20: 50 mg via ORAL
  Filled 2015-04-20: qty 1

## 2015-04-20 MED ORDER — HYDROXYZINE HCL 25 MG PO TABS: 25.0000 mg | ORAL_TABLET | Freq: Three times a day (TID) | ORAL | Status: DC | PRN

## 2015-04-20 MED ORDER — TRAZODONE HCL 100 MG PO TABS
100.0000 mg | ORAL_TABLET | Freq: Every evening | ORAL | Status: DC | PRN
  Administered 2015-04-20: 100 mg via ORAL
  Filled 2015-04-20: qty 1

## 2015-04-20 MED ORDER — IBUPROFEN 200 MG PO TABS
600.0000 mg | ORAL_TABLET | Freq: Three times a day (TID) | ORAL | Status: DC | PRN
  Administered 2015-04-20: 600 mg via ORAL
  Filled 2015-04-20: qty 3

## 2015-04-20 MED ORDER — QUETIAPINE FUMARATE 100 MG PO TABS
100.0000 mg | ORAL_TABLET | Freq: Every day | ORAL | Status: DC
  Administered 2015-04-20: 100 mg via ORAL
  Filled 2015-04-20: qty 1

## 2015-04-20 NOTE — [facility] Counselor (Addendum)
Per Delorise Jacksonori, AC, [facility] under the care of Dr Jama Flavorsobos. Can come at 12:00am.  Dr Clydene PughKnott and Aundra MilletMegan, RN notified.
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
## 2015-04-20 NOTE — ED Notes (Signed)
Patient appears flat, sad. Denies SI, HI, AVH. Reports feelings of depression8/10 and mild anxiety. Reports back pain and restless sleep.  Encouragement offered. Given Motrin. Environment adjusted.  Q 15 safety checks continue.

## 2015-04-20 NOTE — Progress Notes (Signed)
Pt seen by [facility] staff member and provided resources

## 2015-04-21 ENCOUNTER — Inpatient Hospital Stay (HOSPITAL_COMMUNITY)
Admission: AD | Admit: 2015-04-21 | Discharge: 2015-04-27 | DRG: 885 | Disposition: A | Discharge status: Home / Self Care | Payer: Federal, State, Local not specified - Other | Source: Intra-hospital | Attending: Psychiatry | Admitting: Psychiatry

## 2015-04-21 ENCOUNTER — Encounter (HOSPITAL_COMMUNITY): Payer: Self-pay

## 2015-04-21 DIAGNOSIS — F1220 Cannabis dependence, uncomplicated: Secondary | ICD-10-CM | POA: Diagnosis present

## 2015-04-21 DIAGNOSIS — F3130 Bipolar disorder, current episode depressed, mild or moderate severity, unspecified: Secondary | ICD-10-CM | POA: Diagnosis present

## 2015-04-21 DIAGNOSIS — F4310 Post-traumatic stress disorder, unspecified: Secondary | ICD-10-CM | POA: Diagnosis present

## 2015-04-21 DIAGNOSIS — G4700 Insomnia, unspecified: Secondary | ICD-10-CM | POA: Diagnosis present

## 2015-04-21 DIAGNOSIS — Z590 Homelessness: Secondary | ICD-10-CM | POA: Diagnosis not present

## 2015-04-21 DIAGNOSIS — Z87891 Personal history of nicotine dependence: Secondary | ICD-10-CM

## 2015-04-21 DIAGNOSIS — R45850 Homicidal ideations: Secondary | ICD-10-CM | POA: Diagnosis present

## 2015-04-21 DIAGNOSIS — F419 Anxiety disorder, unspecified: Secondary | ICD-10-CM | POA: Diagnosis present

## 2015-04-21 DIAGNOSIS — F319 Bipolar disorder, unspecified: Principal | ICD-10-CM | POA: Diagnosis present

## 2015-04-21 DIAGNOSIS — F332 Major depressive disorder, recurrent severe without psychotic features: Secondary | ICD-10-CM

## 2015-04-21 DIAGNOSIS — F39 Unspecified mood [affective] disorder: Secondary | ICD-10-CM | POA: Diagnosis present

## 2015-04-21 MED ORDER — DIVALPROEX SODIUM ER 500 MG PO TB24
500.0000 mg | ORAL_TABLET | Freq: Every day | ORAL | Status: DC
  Administered 2015-04-21 – 2015-04-23 (×3): 500 mg via ORAL
  Filled 2015-04-21 (×5): qty 1

## 2015-04-21 MED ORDER — ACETAMINOPHEN 325 MG PO TABS: 650.0000 mg | ORAL_TABLET | Freq: Four times a day (QID) | ORAL | Status: DC | PRN

## 2015-04-21 MED ORDER — HYDROXYZINE HCL 25 MG PO TABS
25.0000 mg | ORAL_TABLET | Freq: Four times a day (QID) | ORAL | Status: DC | PRN
  Filled 2015-04-21: qty 1
  Filled 2015-04-21: qty 10

## 2015-04-21 MED ORDER — NICOTINE 21 MG/24HR TD PT24
21.0000 mg | MEDICATED_PATCH | Freq: Every day | TRANSDERMAL | Status: DC
  Administered 2015-04-21 – 2015-04-27 (×7): 21 mg via TRANSDERMAL
  Filled 2015-04-21 (×10): qty 1

## 2015-04-21 MED ORDER — TRAZODONE HCL 50 MG PO TABS
50.0000 mg | ORAL_TABLET | Freq: Every evening | ORAL | Status: DC | PRN
  Administered 2015-04-21: 50 mg via ORAL
  Filled 2015-04-21 (×6): qty 1

## 2015-04-21 MED ORDER — MAGNESIUM HYDROXIDE 400 MG/5ML PO SUSP: 30.0000 mL | Freq: Every day | ORAL | Status: DC | PRN

## 2015-04-21 MED ORDER — SERTRALINE HCL 100 MG PO TABS
100.0000 mg | ORAL_TABLET | Freq: Every day | ORAL | Status: DC
  Administered 2015-04-21 – 2015-04-26 (×6): 100 mg via ORAL
  Filled 2015-04-21 (×10): qty 1

## 2015-04-21 MED ORDER — ALUM & MAG HYDROXIDE-SIMETH 200-200-20 MG/5ML PO SUSP: 30.0000 mL | ORAL | Status: DC | PRN
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
## 2015-04-21 NOTE — [facility] Group Notes (Signed)
[facility] Note  04/21/2015 8:45 AM  Pt did not attend, declined invitation.   Chad CordialLauren Carter, LCSWA 04/21/2015 10:51 AM

## 2015-04-21 NOTE — Progress Notes (Signed)
Pt admitted to adult unit.  39 yr old male presenting with bipolar 1 and PTSD.  Did not want to discuss his problems.  Denies SI/HI/AVH.  Verbally able to contract for safety.  HS grad. [facility] [employer].  Refuses to undress completely for skin assessment.  Advised we would not leave until he cooperated with check.  Pt complied with agitation.  Ate meal and wanted to go to sleep.  At 21:30 received 100 mg of Trazodone and Seroquel. Very sleepy and wanted to go to sleep.

## 2015-04-21 NOTE — [facility] Group Notes (Signed)
[facility] LCSW Group Therapy 04/21/2015 1:15 PM  Type of Therapy: Group Therapy- Emotion Regulation  Pt did not attend, declined invitation.   Fernie Grimm Carter, LCSWA 04/21/2015 2:30 PM  

## 2015-04-21 NOTE — Progress Notes (Signed)
D: Pt presents with flat affect and depressed mood. Pt rates depression 6/10. Hopeless 6/10. Anxiety 8/10. Pt denies suicidal thoughts. Pt stated that he have a lot of family issues that he is dealing with. Pt minimal during shift assessment. Pt reported that he's been off his meds for at least 6 months. Pt compliant with taking Zoloft this morning. No complaints verbalized by pt.  A: Medications administered as ordered per MD. Verbal support given. Pt encouraged to attend groups. 15 minute checks performed for safety.  R: Pt receptive to tx.

## 2015-04-21 NOTE — Tx Team (Signed)
Interdisciplinary Treatment Plan Update (Adult) Date: 04/21/2015   Date: 04/21/2015 10:51 AM  Progress in Treatment:  Attending groups: Pt is new to milieu, continuing to assess  Participating in groups: Pt is new to milieu, continuing to assess  Taking medication as prescribed: Yes  Tolerating medication: Yes  Family/Significant othe contact made: No, CSW assessing for appropriate contact Patient understands diagnosis: Continuing to assess Discussing patient identified problems/goals with staff: Yes  Medical problems stabilized or resolved: Yes  Denies suicidal/homicidal ideation: Yes Patient has not harmed self or Others: Yes   New problem(s) identified: None identified at this time.   Discharge Plan or Barriers:  Additional comments:  Patient and CSW reviewed pt's identified goals and treatment plan. Patient verbalized understanding and agreed to treatment plan. CSW reviewed [facility] "Discharge Process and Patient Involvement" Form. Pt verbalized understanding of information provided and signed form.   Reason for Continuation of Hospitalization:  Depression Medical Issues Medication stabilization Suicidal ideation  Estimated length of stay: 3-5 days  Review of initial/current patient goals per problem list:   1.  Goal(s): Patient will participate in aftercare plan  Met:  No  Target date: 3-5 days from date of admission   As evidenced by: Patient will participate within aftercare plan AEB aftercare provider and housing plan at discharge being identified.  04/21/15: CSW to work with Pt to assess for appropriate discharge plan and faciliate appointments and referrals as needed prior to d/c.  2.  Goal (s): Patient will exhibit decreased depressive symptoms and suicidal ideations.  Met:  No  Target date: 3-5 days from date of admission   As evidenced by: Patient will utilize self rating of depression at 3 or below and demonstrate decreased signs of depression or be deemed stable  for discharge by MD.  05/21/14: Pt rates depression at 6/10; denies SI  Attendees:  Patient:    Family:    Physician: Dr. Parke Poisson, MD  04/21/2015 10:51 AM  Nursing: Lars Pinks, RN Case manager  04/21/2015 10:51 AM  Clinical Social Worker Peri Maris, [place] 04/21/2015 10:51 AM  Other: Tilden Fossa, [place] 04/21/2015 10:51 AM  Clinical: Darrol Angel RN 04/21/2015 10:51 AM  Other: , RN Charge Nurse 04/21/2015 10:51 AM  Other: Hilda Lias, [place], [place] Work [phone]

## 2015-04-22 MED ORDER — TRAZODONE HCL 50 MG PO TABS
50.0000 mg | ORAL_TABLET | Freq: Every evening | ORAL | Status: DC | PRN
  Administered 2015-04-22: 50 mg via ORAL
  Filled 2015-04-22: qty 1
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
## 2015-04-22 NOTE — [facility] Group Notes (Signed)
[facility] 04/22/2015 1:15pm  Type of Therapy: Mental Health Association Presentation  Participation Level: Active  Participation Quality: Attentive  Affect: Appropriate  Cognitive: Oriented  Insight: Developing/Improving  Engagement in Therapy: Engaged  Modes of Intervention: Discussion, Education and Socialization  Summary of Progress/Problems: Mental Health Association (MHA) Speaker came to talk about his personal journey with substance abuse and addiction. The pt processed ways by which to relate to the speaker. MHA speaker provided handouts and educational information pertaining to groups and services [facility]. Pt was engaged in speaker's presentation and was receptive to resources provided.    Dylan CordialLauren Carter, LCSWA 04/22/2015 2:02 PM

## 2015-04-22 NOTE — Progress Notes (Signed)
Patient's mother, Dylan [name] called, and would like call from MD/SW to discuss son's status.  Mother's phone cell [phone]   And   Home [phone].

## 2015-04-22 NOTE — Plan of Care (Signed)
Problem: Consults Goal: Depression Patient Education See Patient Education Module for education specifics.  Outcome: Progressing Nurse discussed depression/coping skills with patient.        

## 2015-04-22 NOTE — Progress Notes (Signed)
D:  Patient's self inventory sheet, patient has poor sleep, sleep medication is not helpful.  Good appetite, low energy level, poor concentration.  Rated depression 10, hopeless 4, anxiety 8.  Denied withdrawals.  Denied SI.  Denied physical problems.  Denied pain.  Goal and plan is to get therapy.  No discharge plans.  Patient is homeless and will have difficulty after discharge. A:  Medications administered per MD orders.  Emotional support and encouragement given patient. R:  Denied SI and HI, contracts for safety.  Denied A/V hallucinations.  Safety maintained with 15 minute checks.
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
## 2015-04-22 NOTE — Progress Notes (Signed)
  D: Pt informed the writer to add his mother's name to list of people able to get info. Informed the Clinical research associatewriter that he's "homeless" and "needs to have a case Production designer, theatre/television/filmmanager, therapist, and social worker". Pt has no other questions or concerns.  A:  Support and encouragement was offered. 15 min checks continued for safety.  R: Pt remains safe.

## 2015-04-22 NOTE — [facility] Group Notes (Signed)
The focus of this group is to educate the patient on the purpose and policies of crisis stabilization and provide a format to answer questions about their admission.  The group details unit policies and expectations of patients while admitted.  Patient did not attend 0900 nurse education orientation group.  Patient stayed in bed.

## 2015-04-23 DIAGNOSIS — F4310 Post-traumatic stress disorder, unspecified: Secondary | ICD-10-CM

## 2015-04-23 DIAGNOSIS — F1220 Cannabis dependence, uncomplicated: Secondary | ICD-10-CM | POA: Clinically undetermined

## 2015-04-23 MED ORDER — TRAZODONE HCL 100 MG PO TABS
100.0000 mg | ORAL_TABLET | Freq: Every day | ORAL | Status: DC
Start: 2015-04-23 — End: 2015-04-27
  Administered 2015-04-23 – 2015-04-26 (×4): 100 mg via ORAL
  Filled 2015-04-23 (×2): qty 1
  Filled 2015-04-23: qty 7
  Filled 2015-04-23 (×3): qty 1

## 2015-04-23 MED ORDER — DIVALPROEX SODIUM ER 250 MG PO TB24
750.0000 mg | ORAL_TABLET | Freq: Every day | ORAL | Status: DC
  Administered 2015-04-24 – 2015-04-26 (×3): 750 mg via ORAL
  Filled 2015-04-23: qty 21
  Filled 2015-04-23 (×4): qty 3

## 2015-04-23 MED ORDER — BIOTENE DRY MOUTH MT LIQD
15.0000 mL | OROMUCOSAL | Status: DC | PRN
  Filled 2015-04-23: qty 15

## 2015-04-23 NOTE — [facility] Group Notes (Signed)
[facility]  04/23/2015 8:45 AM  Pt did not attend, declined invitation.   Chad CordialLauren Carter, LCSWA 04/23/2015 10:07 AM

## 2015-04-23 NOTE — Progress Notes (Signed)
D: Dylan [name] has remained in bed for most of the a.m. He has not attended groups and did not come to med window. He is pleasant but withdrawn upon approach. Denies SI/HI/AVH/pain and contracts for safety. He took meds w/o complaint. On his self inventory, he rates depression 3, hopelessness 3, and anxiety 6. As his goal, he wrote "to relax."  A: Meds given as ordered. Q15 safety checks maintained. Support/encouragement offered. R: Pt remains free from harm and continues with treatment. Will continue to monitor for needs/safety.

## 2015-04-23 NOTE — Plan of Care (Signed)
Problem: Alteration in mood Goal: LTG-Patient reports reduction in suicidal thoughts (Patient reports reduction in suicidal thoughts and is able to verbalize a safety plan for whenever patient is feeling suicidal)  Outcome: Progressing Pt denies SI and verbally contracts for safety.       

## 2015-04-23 NOTE — Progress Notes (Addendum)
[facility] MD Progress Note  04/23/2015 2:57 PM Dylan [name]  MRN:  [id number]    Subjective: Patient states " I am not sleeping. I also have dry mouth ."    Objective : I have discussed case with treatment team and have met with patient. Patient continues to be depressed and isolative . Pt also reports sleep issues. Discussed increasing his sleep medication tonight. Pt has been compliant on his medications , although per nursing -he remains withdrawn and isolative and requires a lot of encouragement. Pt today reports dry mouth , likely from his medications. Pt otherwise denies any new concerns. Pt encouraged to attend groups and participate in milieu.    Principal Problem: Bipolar I disorder, most recent episode depressed ([place]) Diagnosis:   Patient Active Problem List   Diagnosis Date Noted  . Cannabis use disorder, moderate, dependence ([place]) [F12.20] 04/23/2015  . Bipolar I disorder, most recent episode depressed ([place]) [F31.30]   . Posttraumatic stress disorder [F43.10] 04/20/2015   Total Time spent with patient: 25 minutes   Past Medical History:  Past Medical History  Diagnosis Date  . PTSD (post-traumatic stress disorder)   . Bipolar 1 disorder ([place])     Family History: pt denies any hx.  Family psychiatric history: Denies any Hx of mental illness  Social History:  History  Alcohol Use  . Yes     History  Drug Use  . Yes  . Special: Marijuana    Social History   Social History  . Marital Status: Single    Spouse Name: N/A  . Number of Children: N/A  . Years of Education: N/A   Social History Main Topics  . Smoking status: Former Smoker -- 1.00 packs/day    Types: Cigars  . Smokeless tobacco: None  . Alcohol Use: Yes  . Drug Use: Yes    Special: Marijuana  . Sexual Activity: Yes   Other Topics Concern  . None   Social History Narrative   Additional Social History:    Pain Medications: none Prescriptions: Flexima, certraline, trazadone.  Has not  been able to afford since June of this year. Over the Counter: N/A History of alcohol / drug use?: Yes Name of Substance 1: Marijuana 1 - Age of First Use: teens 1 - Amount (size/oz): a joint or two  1 - Frequency: 2-3 times in a month at most 1 - Duration: On-going 1 - Last Use / Amount: 04/18/15  Sleep: Poor  Appetite:  Fair  Current Medications: Current Facility-Administered Medications  Medication Dose Route Frequency Provider Last Rate Last Dose  . acetaminophen (TYLENOL) tablet 650 mg  650 mg Oral Q6H PRN Laverle Hobby, PA-C      . alum & mag hydroxide-simeth (MAALOX/MYLANTA) 200-200-20 MG/5ML suspension 30 mL  30 mL Oral Q4H PRN Laverle Hobby, PA-C      . [START ON 04/24/2015] divalproex (DEPAKOTE ER) 24 hr tablet 750 mg  750 mg Oral QHS Juvencio Verdi, MD      . hydrOXYzine (ATARAX/VISTARIL) tablet 25 mg  25 mg Oral Q6H PRN Laverle Hobby, PA-C      . magnesium hydroxide (MILK OF MAGNESIA) suspension 30 mL  30 mL Oral Daily PRN Laverle Hobby, PA-C      . nicotine (NICODERM CQ - dosed in mg/24 hours) patch 21 mg  21 mg Transdermal Daily Jenne Campus, MD   21 mg at 04/23/15 0825  . sertraline (ZOLOFT) tablet 100 mg  100 mg Oral  Daily Laverle Hobby, PA-C   100 mg at 04/23/15 6063  . traZODone (DESYREL) tablet 100 mg  100 mg Oral QHS Ursula Alert, MD        Lab Results: No results found for this or any previous visit (from the past 48 hour(s)).  Physical Findings: AIMS: Facial and Oral Movements Muscles of Facial Expression: None, normal Lips and Perioral Area: None, normal Jaw: None, normal Tongue: None, normal,Extremity Movements Upper (arms, wrists, hands, fingers): None, normal Lower (legs, knees, ankles, toes): None, normal, Trunk Movements Neck, shoulders, hips: None, normal, Overall Severity Severity of abnormal movements (highest score from questions above): None, normal Incapacitation due to abnormal movements: None, normal Patient's awareness of  abnormal movements (rate only patient's report): No Awareness, Dental Status Current problems with teeth and/or dentures?: No Does patient usually wear dentures?: No  CIWA:  CIWA-Ar Total: 1 COWS:  COWS Total Score: 1  Musculoskeletal: Strength & Muscle Tone: within normal limits Gait & Station: normal Patient leans: N/A  Psychiatric Specialty Exam: Review of Systems  Psychiatric/Behavioral: Positive for depression. The patient is nervous/anxious and has insomnia.   All other systems reviewed and are negative.  no chest pain, no shortness of breath, no vomiting   Blood pressure 97/71, pulse 75, temperature 98.2 F (36.8 C), temperature source Oral, resp. rate 18.There is no height or weight on file to calculate BMI.  General Appearance: Fairly Groomed  Engineer, water::  Fair  Speech:  Normal Rate  Volume:  Decreased  Mood:  Depressed  Affect:  Constricted  Thought Process:  Linear  Orientation:  Other:  fully alert and attentive   Thought Content:  denies halluicnations, no delusions, does not appear internally preoccupied   Suicidal Thoughts:  No today denies plan or intention of hurting self or of SI  Homicidal Thoughts:  No today denies any ongoing homicidal ideations towards stepfather  Memory:  recent and remote grossly intact , immediate - fair  Judgement:  Fair  Insight:  Fair  Psychomotor Activity:  Decreased  Concentration:  Good  Recall:  Good  Fund of Knowledge:Good  Language: Good  Akathisia:  Negative  Handed:  Right  AIMS (if indicated):     Assets:  Desire for Improvement Physical Health Resilience  ADL's:  Intact  Cognition: WNL  Sleep:  Number of Hours: 6.75   Assessment - Patient presents depressed, sad, isolative,continues to have sleep issues. Pt will continue to need medications as well as inpatient stabilization.   Treatment Plan Summary: Daily contact with patient to assess and evaluate symptoms and progress in treatment, Medication management,  Plan inpatient treatment and medications as below   I have reviewed previous plan per Dr.Cobbos- will continue plan except where indicated below. Continue to encourage groups participation to address coping skills and symptom reduction Increase Depakote ER to 750 mg PO  QHS. Depakote level on 04/27/15. Continue Zoloft 100 mgrs QDAY for depression Increase Trazodone to 100 mgrs po QHS  for insomnia. Continue Vistaril 25 mgrs Q 6 hours PRN Anxiety as needed  Treatment team working on disposition options  Merrit Friesen MD 04/23/2015, 2:57 PM

## 2015-04-23 NOTE — [facility] Group Notes (Signed)
[facility] LCSW Group Therapy 04/23/2015 1:15pm  Type of Therapy: Group Therapy- Feelings Around Relapse and Recovery  Pt did not attend, declined invitation.   Modes of Intervention: Clarification, Confrontation, Discussion, Education, Exploration, Limit-setting, Orientation, Problem-solving, Rapport Building, Reality Testing, Socialization and Support   Lamar SprinklesLauren Carter, LCSWA [phone] 04/23/2015 4:56 PM

## 2015-04-23 NOTE — Progress Notes (Signed)
D: Pt has depressed affect and mood.  He was in bed in his room upon initial approach.  He reports his day was "all right" and "I went to therapy earlier."  Pt denies SI/HI, denies hallucinations, denies pain.  Pt has stayed in his room for the majority of the night.   A: Introduced self to pt.  Met with pt 1:1 and offered support and encouragement.  Actively listened to pt. PRN medication administered for sleep. R: Pt is compliant with medications.  Pt verbally contracts for safety.  Will continue to monitor and assess.

## 2015-04-24 LAB — TSH: TSH: 2.422 u[IU]/mL (ref 0.350–4.500)

## 2015-04-24 NOTE — Progress Notes (Signed)
D: Pt is alert and oriented x 4. Pt denies any form of depression, anxiety, pain, SI, HI and AVH; states, "am doing fine." Pt however has a flat affect and withdrawn to his room. Pt remained nonviolent, calm and cooperative through the shift assessment.  A: Medications offered as prescribed.  Support, encouragement, and safe environment provided.  15-minute safety checks continue.  R: Pt was med compliant.  Pt did not attend group. Safety checks continue

## 2015-04-24 NOTE — Progress Notes (Signed)
Patient ID: [name], male   DOB: Nov 13, 1975, 39 y.o.   MRN: [id number]  Adult Psychoeducational Group Note  Date:  04/24/2015 Time:  09:30am  Group Topic/Focus:  Coping With Mental Health Crisis:   The purpose of this group is to help patients identify strategies for coping with mental health crisis.  Group discusses possible causes of crisis and ways to manage them effectively.  Participation Level:  Did Not Attend  Participation Quality: n/a  Affect: n/a  Cognitive: n/a  Insight: n/a  Engagement in Group:  n/a  Modes of Intervention:  Activity, Discussion, Education and Support  Additional Comments:  Pt did not attend group, pt in bed asleep.   Aurora Maskwyman, Sheena Simonis E 04/24/2015, 10:49 AM

## 2015-04-24 NOTE — Progress Notes (Signed)
[facility] MD Progress Note  04/24/2015 10:17 AM JORI THRALL  MRN:  [id number]    Subjective: Patient states " I am still depressed 10/10 ."    Objective :STCLAIR SZYMBORSKI is awake, alert and oriented X4 patient has a flat affect , found resting in bedroom.  Denies suicidal or homicidal ideation. Denies auditory or visual hallucination and does not appear to be responding to internal stimuli. Patient interacts well with staff and others. Patient reports he is medication compliant without mediation side effects. denies learning new coping skills. States "I don't like being around other people". States his depression "10/10." Reports good appetite and resting well.  Support, encouragement and reassurance was provided.     Principal Problem: Bipolar I disorder, most recent episode depressed (HCC) Diagnosis:   Patient Active Problem List   Diagnosis Date Noted  . Cannabis use disorder, moderate, dependence (HCC) [F12.20] 04/23/2015  . Bipolar I disorder, most recent episode depressed (HCC) [F31.30]   . Posttraumatic stress disorder [F43.10] 04/20/2015   Total Time spent with patient: 25 minutes   Past Medical History:  Past Medical History  Diagnosis Date  . PTSD (post-traumatic stress disorder)   . Bipolar 1 disorder (HCC)     Family History: pt denies any hx.  Family psychiatric history: Denies any Hx of mental illness  Social History:  History  Alcohol Use  . Yes     History  Drug Use  . Yes  . Special: Marijuana    Social History   Social History  . Marital Status: Single    Spouse Name: N/A  . Number of Children: N/A  . Years of Education: N/A   Social History Main Topics  . Smoking status: Former Smoker -- 1.00 packs/day    Types: Cigars  . Smokeless tobacco: None  . Alcohol Use: Yes  . Drug Use: Yes    Special: Marijuana  . Sexual Activity: Yes   Other Topics Concern  . None   Social History Narrative   Additional Social History:    Pain Medications:  none Prescriptions: Flexima, certraline, trazadone.  Has not been able to afford since June of this year. Over the Counter: N/A History of alcohol / drug use?: Yes Name of Substance 1: Marijuana 1 - Age of First Use: teens 1 - Amount (size/oz): a joint or two  1 - Frequency: 2-3 times in a month at most 1 - Duration: On-going 1 - Last Use / Amount: 04/18/15  Sleep: Fair  Appetite:  Fair  Current Medications: Current Facility-Administered Medications  Medication Dose Route Frequency Provider Last Rate Last Dose  . acetaminophen (TYLENOL) tablet 650 mg  650 mg Oral Q6H PRN Kerry Hough, PA-C      . alum & mag hydroxide-simeth (MAALOX/MYLANTA) 200-200-20 MG/5ML suspension 30 mL  30 mL Oral Q4H PRN Kerry Hough, PA-C      . antiseptic oral rinse (BIOTENE) solution 15 mL  15 mL Mouth Rinse PRN Jomarie Longs, MD      . divalproex (DEPAKOTE ER) 24 hr tablet 750 mg  750 mg Oral QHS Saramma Eappen, MD      . hydrOXYzine (ATARAX/VISTARIL) tablet 25 mg  25 mg Oral Q6H PRN Kerry Hough, PA-C      . magnesium hydroxide (MILK OF MAGNESIA) suspension 30 mL  30 mL Oral Daily PRN Kerry Hough, PA-C      . nicotine (NICODERM CQ - dosed in mg/24 hours) patch 21 mg  21 mg  Transdermal Daily Craige CottaFernando A Cobos, MD   21 mg at 04/24/15 0848  . sertraline (ZOLOFT) tablet 100 mg  100 mg Oral Daily Kerry HoughSpencer E Simon, PA-C   100 mg at 04/24/15 0848  . traZODone (DESYREL) tablet 100 mg  100 mg Oral QHS Jomarie LongsSaramma Eappen, MD   100 mg at 04/23/15 2131    Lab Results:  Results for orders placed or performed during the hospital encounter of 04/21/15 (from the past 48 hour(s))  TSH     Status: None   Collection Time: 04/24/15  6:20 AM  Result Value Ref Range   TSH 2.422 0.350 - 4.500 uIU/mL    Comment[facility]    Physical Findings: AIMS: Facial and Oral Movements Muscles of Facial Expression: None, normal Lips and Perioral Area: None, normal Jaw: None,  normal Tongue: None, normal,Extremity Movements Upper (arms, wrists, hands, fingers): None, normal Lower (legs, knees, ankles, toes): None, normal, Trunk Movements Neck, shoulders, hips: None, normal, Overall Severity Severity of abnormal movements (highest score from questions above): None, normal Incapacitation due to abnormal movements: None, normal Patient's awareness of abnormal movements (rate only patient's report): No Awareness, Dental Status Current problems with teeth and/or dentures?: No Does patient usually wear dentures?: No  CIWA:  CIWA-Ar Total: 1 COWS:  COWS Total Score: 1  Musculoskeletal: Strength & Muscle Tone: within normal limits Gait & Station: normal Patient leans: N/A  Psychiatric Specialty Exam: Review of Systems  Psychiatric/Behavioral: Positive for depression. The patient is nervous/anxious. The patient does not have insomnia.   All other systems reviewed and are negative.  no chest pain, no shortness of breath, no vomiting   Blood pressure 111/66, pulse 75, temperature 97.6 F (36.4 C), temperature source Oral, resp. rate 18.There is no height or weight on file to calculate BMI.  General Appearance: Fairly Groomed  Patent attorneyye Contact::  Fair  Speech:  Normal Rate  Volume:  Decreased  Mood:  Depressed  Affect:  Constricted  Thought Process:  Linear  Orientation:  Other:  fully alert and attentive   Thought Content:  denies halluicnations, no delusions, does not appear internally preoccupied   Suicidal Thoughts:  No t  Homicidal Thoughts:  No  stepfather  Memory:  Immediate;   Fair Recent;   Fair Remote;   Fair  Judgement:  Fair  Insight:  Fair  Psychomotor Activity:  Restlessness  Concentration:  Good  Recall:  Good  Fund of Knowledge:Good  Language: Good  Akathisia:  Negative  Handed:  Right  AIMS (if indicated):     Assets:  Desire for Improvement Physical Health Resilience  ADL's:  Intact  Cognition: WNL  Sleep:  Number of Hours: 6.75      I agree with current treatment plan on 04/24/2015, Patient seen face-to-face for psychiatric evaluation follow-up, chart reviewed and Reviewed the information documented and agree with the treatment plan listed.   Treatment Plan Summary: Daily contact with patient to assess and evaluate symptoms and progress in treatment, Medication management, Plan inpatient treatment and medications as below   I have reviewed previous plan per Dr.Cobbos- will continue plan except where indicated below. Continue to encourage groups participation to address coping skills and symptom reduction Increase Depakote ER to 750 mg PO  QHS. Depakote level on 04/27/15. Continue Zoloft 100 mgrs QDAY for depression Increase Trazodone to 100 mgrs po QHS  for insomnia. Continue with Vistaril 25 mgrs Q 6 hours PRN Anxiety as needed  Treatment team working on  disposition options  Oneta Rack FNP-BC 04/24/2015, 10:17 AM  I reviewed chart and agreed with the findings and treatment Plan.  Kathryne Sharper, MD

## 2015-04-24 NOTE — Progress Notes (Signed)
Patient ID: Dylan [name],Dylan [name] male   DOB: 05/30/1975, [age] y.o.   MRN: [id number]   Pt currently presents with a flat affect and isolative behavior. Per self inventory, pt rates depression at a 3, hopelessness 2 and anxiety 7. Pt's daily goal is "clearing my mind" and they intend to do so by "think less about my problems." Pt reports poor sleep, a good appetite, low energy and poor concentration. Pt remains in bed for most of the day today, pt reports "I don't like being around other people."  Pt provided with medications per providers orders. Pt's labs and vitals were monitored throughout the day. Pt supported emotionally and encouraged to express concerns and questions. Pt educated on medications. Pts mother given an update on pts care, note put in pts echart about moms concerns.   Pt's safety ensured with 15 minute and environmental checks. Pt currently denies SI/HI and A/V hallucinations. Pt verbally agrees to seek staff if SI/HI or A/VH occurs and to consult with staff before acting on these thoughts. Pt reports concern about living in a shelter due to its conditions. Pt verbalizing understanding of need to get off current living situation for his children but still reverts to blaming others during our 1:1 conversation. Will continue to monitor.

## 2015-04-24 NOTE — Progress Notes (Signed)
Psychoeducational Group Note  Date:  04/24/2015 Time:  2233  Group Topic/Focus:  Wrap-Up Group:   The focus of this group is to help patients review their daily goal of treatment and discuss progress on daily workbooks.  Participation Level: Did Not Attend  Participation Quality:  Not Applicable  Affect:  Not Applicable  Cognitive:  Not Applicable  Insight:  Not Applicable  Engagement in Group: Not Applicable  Additional Comments:  The patient did not attend group this evening.   Hazle CocaGOODMAN, Jens Siems S 04/24/2015, 10:33 PM

## 2015-04-24 NOTE — [facility] Group Notes (Signed)
[facility] Group Notes: (Clinical Social Work)   04/24/2015      Type of Therapy:  Group Therapy   Participation Level:  Did Not Attend despite MHT prompting   Ambrose MantleMareida Grossman-Orr, LCSW 04/24/2015, 4:28 PM

## 2015-04-25 NOTE — [facility] Group Notes (Signed)
[facility] Group Notes: (Clinical Social Work)   04/25/2015      Type of Therapy:  Group Therapy   Participation Level:  Did Not Attend despite MHT prompting   Ambrose MantleMareida Grossman-Orr, LCSW 04/25/2015, 4:39 PM

## 2015-04-25 NOTE — Progress Notes (Signed)
Patient ID: Idelle LeechShon J Dues, male   DOB: [birth year], 39 y.o.   MRN: [id number]  Adult Psychoeducational Group Note  Date:  04/25/2015 Time: 09:45AM  Group Topic/Focus:  Developing a Wellness Toolbox:   The focus of this group is to help patients develop a "wellness toolbox" with skills and strategies to promote recovery upon discharge.  Participation Level:  Did Not Attend  Participation Quality: n/a  Affect: n/a  Cognitive: n/a  Insight: n/a  Engagement in Group: n/a  Modes of Intervention:  Activity, Discussion, Education and Support  Additional Comments:  Pt did not attend group, pt in bed asleep.  Aurora Maskwyman, Thatiana Renbarger E 04/25/2015, 12:03 PM

## 2015-04-25 NOTE — Progress Notes (Signed)
D: Pt is alert and oriented x 4. Pt continues denies any form of depression, anxiety, pain, SI, HI and AVH; states, "I am doing fine, just ready to go." Pt continues to have a flat affect and isolate self to his room. Pt remained nonviolent, calm and cooperative through the shift assessment. A: Medications offered as prescribed.  Support, encouragement, and safe environment provided.  15-minute safety checks continue. R: Pt was med compliant.  Pt did not attend group. Safety checks continue

## 2015-04-25 NOTE — Progress Notes (Signed)
[facility]  04/25/2015 2:00 PM Dylan [name]  MRN:  [id number]    Subjective: Patient states " I am okay, I just want to rest."    Objective :Dylan [name] is awake, alert and oriented X4 patient has a flat affect , found resting in bedroom.  Denies suicidal or homicidal ideation. Denies auditory or visual hallucination and does not appear to be responding to internal stimuli. Patient reports that he interacts well with staff and others. Patient reports he is medication compliant without mediation side effects. denies learning new coping skills. States "I don't like being around other people" I just want to rest I am okay. States his depression is still "10/10." Reports good appetite and resting well.  Support, encouragement and reassurance was provided.     Principal Problem: Bipolar I disorder, most recent episode depressed (HCC) Diagnosis:   Patient Active Problem List   Diagnosis Date Noted  . Cannabis use disorder, moderate, dependence (HCC) [F12.20] 04/23/2015  . Bipolar I disorder, most recent episode depressed (HCC) [F31.30]   . Posttraumatic stress disorder [F43.10] 04/20/2015   Total Time spent with patient: 25 minutes   Past Medical History:  Past Medical History  Diagnosis Date  . PTSD (post-traumatic stress disorder)   . Bipolar 1 disorder (HCC)     Family History: pt denies any hx.  Family psychiatric history: Denies any Hx of mental illness  Social History:  History  Alcohol Use  . Yes     History  Drug Use  . Yes  . Special: Marijuana    Social History   Social History  . Marital Status: Single    Spouse Name: N/A  . Number of Children: N/A  . Years of Education: N/A   Social History Main Topics  . Smoking status: Former Smoker -- 1.00 packs/day    Types: Cigars  . Smokeless tobacco: None  . Alcohol Use: Yes  . Drug Use: Yes    Special: Marijuana  . Sexual Activity: Yes   Other Topics Concern  . None   Social History Narrative    Additional Social History:    Pain Medications: none Prescriptions: Flexima, certraline, trazadone.  Has not been able to afford since June of this year. Over the Counter: N/A History of alcohol / drug use?: Yes Name of Substance 1: Marijuana 1 - Age of First Use: teens 1 - Amount (size/oz): a joint or two  1 - Frequency: 2-3 times in a month at most 1 - Duration: On-going 1 - Last Use / Amount: 04/18/15  Sleep: Good  Appetite:  Good  Current Medications: Current Facility-Administered Medications  Medication Dose Route Frequency Provider Last Rate Last Dose  . acetaminophen (TYLENOL) tablet 650 mg  650 mg Oral Q6H PRN Kerry HoughSpencer E Simon, PA-C      . alum & mag hydroxide-simeth (MAALOX/MYLANTA) 200-200-20 MG/5ML suspension 30 mL  30 mL Oral Q4H PRN Kerry HoughSpencer E Simon, PA-C      . antiseptic oral rinse (BIOTENE) solution 15 mL  15 mL Mouth Rinse PRN Jomarie LongsSaramma Eappen, MD      . divalproex (DEPAKOTE ER) 24 hr tablet 750 mg  750 mg Oral QHS Saramma Eappen, MD   750 mg at 04/24/15 2127  . hydrOXYzine (ATARAX/VISTARIL) tablet 25 mg  25 mg Oral Q6H PRN Kerry HoughSpencer E Simon, PA-C      . magnesium hydroxide (MILK OF MAGNESIA) suspension 30 mL  30 mL Oral Daily PRN Kerry HoughSpencer E Simon, PA-C      .  nicotine (NICODERM CQ - dosed in mg/24 hours) patch 21 mg  21 mg Transdermal Daily Craige Cotta, MD   21 mg at 04/25/15 0981  . sertraline (ZOLOFT) tablet 100 mg  100 mg Oral Daily Kerry Hough, PA-C   100 mg at 04/25/15 1914  . traZODone (DESYREL) tablet 100 mg  100 mg Oral QHS Jomarie Longs, MD   100 mg at 04/24/15 2127    Lab Results:  Results for orders placed or performed during the hospital encounter of 04/21/15 (from the past 48 hour(s))  TSH     Status: None   Collection Time: 04/24/15  6:20 AM  Result Value Ref Range   TSH 2.422 0.350 - 4.500 uIU/mL    Comment: Performed at [facility]    Physical Findings: AIMS: Facial and Oral Movements Muscles of Facial Expression:  None, normal Lips and Perioral Area: None, normal Jaw: None, normal Tongue: None, normal,Extremity Movements Upper (arms, wrists, hands, fingers): None, normal Lower (legs, knees, ankles, toes): None, normal, Trunk Movements Neck, shoulders, hips: None, normal, Overall Severity Severity of abnormal movements (highest score from questions above): None, normal Incapacitation due to abnormal movements: None, normal Patient's awareness of abnormal movements (rate only patient's report): No Awareness, Dental Status Current problems with teeth and/or dentures?: No Does patient usually wear dentures?: No  CIWA:  CIWA-Ar Total: 1 COWS:  COWS Total Score: 1  Musculoskeletal: Strength & Muscle Tone: within normal limits Gait & Station: normal Patient leans: N/A  Psychiatric Specialty Exam: Review of Systems  Constitutional: Negative.   HENT: Negative.   Respiratory: Negative.   Cardiovascular: Negative.   Gastrointestinal: Negative.   Genitourinary: Negative.   Musculoskeletal: Negative.   Skin: Negative.   Neurological: Negative.   Endo/Heme/Allergies: Negative.   Psychiatric/Behavioral: Positive for depression. The patient is nervous/anxious. The patient does not have insomnia.   All other systems reviewed and are negative.  no chest pain, no shortness of breath, no vomiting   Blood pressure 94/56, pulse 88, temperature 97.6 F (36.4 C), temperature source Oral, resp. rate 16.There is no height or weight on file to calculate BMI.  General Appearance: Fairly Groomed paper scrubs  Eye Contact::  Fair  Speech:  Normal Rate  Volume:  Decreased  Mood:  Depressed  Affect:  Constricted, Depressed and Flat  Thought Process:  Linear  Orientation:  Other:  fully alert and attentive   Thought Content:  denies halluicnations, no delusions, does not appear internally preoccupied   Suicidal Thoughts:  No   Homicidal Thoughts:  No    Memory:  Immediate;   Fair Recent;   Fair Remote;   Fair   Judgement:  Fair  Insight:  Fair  Psychomotor Activity:  Normal  Concentration:  Good  Recall:  Good  Fund of Knowledge:Good  Language: Good  Akathisia:  Negative  Handed:  Right  AIMS (if indicated):     Assets:  Desire for Improvement Physical Health Resilience  ADL's:  Intact  Cognition: WNL  Sleep:  Number of Hours: 6.75     I agree with current treatment plan on 04/25/2015, Patient seen face-to-face for psychiatric evaluation follow-up, chart reviewed and Reviewed the information documented and agree with the treatment plan listed.   Treatment Plan Summary: Daily contact with patient to assess and evaluate symptoms and progress in treatment, Medication management, Plan inpatient treatment and medications as below   I have reviewed previous plan per Dr.Cobbos- will continue plan except where indicated below.  Continue to encourage groups participation to address coping skills and symptom reduction Increase Depakote ER to 750 mg PO  QHS. Depakote level on 04/27/15. Continue Zoloft 100 mgrs QDAY for depression Increase Trazodone to 100 mgrs po QHS  for insomnia. Continue with Vistaril 25 mgrs Q 6 hours PRN Anxiety as needed  Treatment team working on disposition options  Oneta Rack FNP-BC 04/25/2015, 2:00 PM  I reviewed chart and agreed with the findings and treatment Plan.  Kathryne Sharper, MD

## 2015-04-25 NOTE — Progress Notes (Signed)
Psychoeducational Group Note  Date:  04/25/2015 Time:  2344  Group Topic/Focus:  Wrap-Up Group:   The focus of this group is to help patients review their daily goal of treatment and discuss progress on daily workbooks.  Participation Level: Did Not Attend  Participation Quality:  Not Applicable  Affect:  Not Applicable  Cognitive:  Not Applicable  Insight:  Not Applicable  Engagement in Group: Not Applicable  Additional Comments:  The patient did not attend group this evening.   Hazle CocaGOODMAN, Evelena Masci S 04/25/2015, 11:43 PM

## 2015-04-25 NOTE — Progress Notes (Signed)
Patient ID: Dylan LeechShon J [name], male   DOB: May 12, 1976, 39 y.o.   MRN: [id number]  Pt currently presents with a flat affect and depressed behavior. Per self inventory, pt rates depression at a 2, hopelessness 2 and anxiety 5. Pt's daily goal is to "be better than yesterday" and they intend to do so by "pray." Pt reports good sleep, a good appetite, normal energy and low concentration. Pt writes that his discharge plan is to "seek treatment and recover."   Pt provided with medications per providers orders. Pt's labs and vitals were monitored throughout the day. Pt supported emotionally and encouraged to express concerns and questions. Pt educated on medications and assertiveness techniques.  Pt's safety ensured with 15 minute and environmental checks. Pt currently denies SI/HI and A/V hallucinations. Pt verbally agrees to seek staff if SI/HI or A/VH occurs and to consult with staff before acting on these thoughts. Will continue POC.

## 2015-04-25 NOTE — Plan of Care (Signed)
Problem: Alteration in mood Goal: LTG-Pt's behavior demonstrates decreased signs of depression (Patient's behavior demonstrates decreased signs of depression to the point the patient is safe to return home and continue treatment in an outpatient setting)  Outcome: Progressing Pt in milieu more, pt in bed less today

## 2015-04-25 NOTE — Progress Notes (Deleted)
D: Pt is alert and oriented x 4. Pt at the time of assessment endorses mild depression; she states, "am glad I came in; hearing other issues has helped me feel a little better." Pt denies anxiety, pain, SI, HI and AVH. Pt continues to have a flat affect and isolate self to her room reading. Pt remained nonviolent, calm and cooperative through the shift assessment. A: Medications offered as prescribed. Support, encouragement, and safe environment provided. 15-minute safety checks continue. R: Pt was med compliant. Pt attend group. Safety checks continue  

## 2015-04-26 MED ORDER — SERTRALINE HCL 50 MG PO TABS
150.0000 mg | ORAL_TABLET | Freq: Every day | ORAL | Status: DC
  Administered 2015-04-27: 150 mg via ORAL
  Filled 2015-04-26 (×2): qty 3
  Filled 2015-04-26: qty 21

## 2015-04-26 NOTE — [facility] Group Notes (Signed)
[facility]  04/26/2015 8:45 AM  Pt did not attend, declined invitation.   Chad CordialLauren Carter, LCSWA 04/26/2015 10:03 AM
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
## 2015-04-26 NOTE — Progress Notes (Signed)
Adult Psychoeducational Group Note  Date:  04/26/2015 Time:  8:54 PM  Group Topic/Focus:  Wrap-Up Group:   The focus of this group is to help patients review their daily goal of treatment and discuss progress on daily workbooks.  Participation Level:  Active  Participation Quality:  Appropriate and Attentive  Affect:  Appropriate  Cognitive:  Appropriate  Insight: Appropriate and Good  Engagement in Group:  Engaged  Modes of Intervention:  Education  Additional Comments:  Pt had a ok day. Pt opened up in earlier group meeting which he stated he never does. Pt goal for tomorrow is to go home.   Merlinda FrederickKeshia S Jessic Standifer 04/26/2015, 8:54 PM

## 2015-04-26 NOTE — Progress Notes (Signed)
Patient ID: Dylan [name], male   DOB: 02/17/1976, 39 y.o.   MRN: [id number] D: Patient in dayroom on approach joking with peers. Pt reports everyone needs a pick me up and he is happy to do that. Pt reports he is discharging tomorrow to see his babies. Pt reports he is tolerating medication well. Pt denies SI/HI/AVH and pain. Pt attended and participated in evening wrap up group. Cooperative with assessment.   A: Met with pt 1:1. Medications administered as prescribed. Support and encouragement provided. Pt encouraged to discuss feelings and come to staff with any question or concerns.   R: Patient remains safe and complaint with medications.  

## 2015-04-26 NOTE — Progress Notes (Signed)
D: Pt presents with flat affect and depressed mood. Pt rates decreased depression 2/10. Anxiety 2/10. Pt reports no suicidal or homicidal thoughts at this time. Pt reports fair sleep and appetite. Pt continues to isolate in his room and have minimal interaction on the unit. Pt noncompliant with attending groups. Pt compliant with taking meds and no adverse reaction to meds verbalized by pt. A: Medications administered as ordered per MD. Verbal support given. Pt encouraged to attend groups. 15 minute checks performed for safety. R: Pt receptive to tx.

## 2015-04-26 NOTE — Tx Team (Signed)
Interdisciplinary Treatment Plan Update (Adult) Date: 04/26/2015   Date: 04/26/2015 12:42 PM  Progress in Treatment:  Attending groups: No Participating in groups: No Taking medication as prescribed: Yes  Tolerating medication: Yes  Family/Significant othe contact made: No, CSW attempting to make contact with mother Patient understands diagnosis: Continuing to assess Discussing patient identified problems/goals with staff: Yes  Medical problems stabilized or resolved: Yes  Denies suicidal/homicidal ideation: Yes Patient has not harmed self or Others: Yes   New problem(s) identified: None identified at this time.   Discharge Plan or Barriers: Pt will return home and follow-up with [facility]  Additional comments:  Patient and CSW reviewed pt's identified goals and treatment plan. Patient verbalized understanding and agreed to treatment plan. CSW reviewed [facility] "Discharge Process and Patient Involvement" Form. Pt verbalized understanding of information provided and signed form.   Reason for Continuation of Hospitalization:  Depression Medical Issues Medication stabilization Suicidal ideation  Estimated length of stay: 1-2 days  Review of initial/current patient goals per problem list:   1.  Goal(s): Patient will participate in aftercare plan  Met:  Yes  Target date: 3-5 days from date of admission   As evidenced by: Patient will participate within aftercare plan AEB aftercare provider and housing plan at discharge being identified.  04/21/15: CSW to work with Pt to assess for appropriate discharge plan and faciliate appointments and referrals as needed prior to d/c. 04/26/15: Pt will return home and follow-up with [facility]  2.  Goal (s): Patient will exhibit decreased depressive symptoms and suicidal ideations.  Met:  Yes  Target date: 3-5 days from date of admission   As evidenced by: Patient will utilize self rating of depression at 3 or below and demonstrate decreased signs  of depression or be deemed stable for discharge by MD.  04/21/15: Pt rates depression at 6/10; denies SI  04/26/15: Pt rates depression at 2/10; denies SI  Attendees:  Patient:    Family:    Physician: Dr. Parke Poisson, MD  04/26/2015 12:42 PM  Nursing: Lars Pinks, RN Case manager  04/26/2015 12:42 PM  Clinical Social Worker Peri Maris, [place] 04/26/2015 12:42 PM  Other: Tilden Fossa, [place] 04/26/2015 12:42 PM  Clinical: Darrol Angel RN 04/26/2015 12:42 PM  Other: , RN Charge Nurse 04/26/2015 12:42 PM  Other: Hilda Lias, [place], [place] Work [phone]

## 2015-04-26 NOTE — Progress Notes (Signed)
Patient ID: Dylan [name], male   DOB: [birth year], 39 y.o.   MRN: [id number] [facility] MD Progress Note  04/26/2015 2:49 PM Dylan [name]  MRN:  [id number]    Subjective: Patient reports partial improvement compared to his admission. States he is feeling better, and is now hoping for discharge soon. States medications have helped, and does not endorse side effects. He denies any ongoing homicidal or violent ideations towards step father or towards anyone else and states " I am not thinking like that ". He is future oriented , and states his plan is to go live with a cousin for a period of time after discharge.  Objective :I have discussed case with treatment team and have met with patient . Patient presenting with improvement compared to admission.  Mood is partially improved, and he smiles more often. No irritability, no anger noted.  As noted above, homicidal /violent ideations have resolved, and patient denying any HI at this time. He states he is tolerating medications well, denies side effects. States he had good visit from mother, and that she is helping set up disposition of him going to live with cousin after discharge. Patient's group attendance has tended to be limited, which is partially related to anxiety, possibly some social anxiety symptoms versus PTSD related avoidance. This is improving as well, and is noted to spend more time in day room at present .     Principal Problem: Bipolar I disorder, most recent episode depressed ([place]) Diagnosis:   Patient Active Problem List   Diagnosis Date Noted  . Cannabis use disorder, moderate, dependence ([place]) [F12.20] 04/23/2015  . Bipolar I disorder, most recent episode depressed ([place]) [F31.30]   . Posttraumatic stress disorder [F43.10] 04/20/2015   Total Time spent with patient: 20 minutes   Past Medical History:  Past Medical History  Diagnosis Date  . PTSD (post-traumatic stress disorder)   . Bipolar 1 disorder ([place])     Family  History: pt denies any hx.  Family psychiatric history: Denies any Hx of mental illness  Social History:  History  Alcohol Use  . Yes     History  Drug Use  . Yes  . Special: Marijuana    Social History   Social History  . Marital Status: Single    Spouse Name: N/A  . Number of Children: N/A  . Years of Education: N/A   Social History Main Topics  . Smoking status: Former Smoker -- 1.00 packs/day    Types: Cigars  . Smokeless tobacco: None  . Alcohol Use: Yes  . Drug Use: Yes    Special: Marijuana  . Sexual Activity: Yes   Other Topics Concern  . None   Social History Narrative   Additional Social History:    Pain Medications: none Prescriptions: Flexima, certraline, trazadone.  Has not been able to afford since June of this year. Over the Counter: N/A History of alcohol / drug use?: Yes Name of Substance 1: Marijuana 1 - Age of First Use: teens 1 - Amount (size/oz): a joint or two  1 - Frequency: 2-3 times in a month at most 1 - Duration: On-going 1 - Last Use / Amount: 04/18/15  Sleep: Good  Appetite:  Good  Current Medications: Current Facility-Administered Medications  Medication Dose Route Frequency Provider Last Rate Last Dose  . acetaminophen (TYLENOL) tablet 650 mg  650 mg Oral Q6H PRN Laverle Hobby, PA-C      . alum & mag hydroxide-simeth (MAALOX/MYLANTA) 200-200-20  MG/5ML suspension 30 mL  30 mL Oral Q4H PRN Laverle Hobby, PA-C      . antiseptic oral rinse (BIOTENE) solution 15 mL  15 mL Mouth Rinse PRN Ursula Alert, MD      . divalproex (DEPAKOTE ER) 24 hr tablet 750 mg  750 mg Oral QHS Ursula Alert, MD   750 mg at 04/25/15 2120  . hydrOXYzine (ATARAX/VISTARIL) tablet 25 mg  25 mg Oral Q6H PRN Laverle Hobby, PA-C      . magnesium hydroxide (MILK OF MAGNESIA) suspension 30 mL  30 mL Oral Daily PRN Laverle Hobby, PA-C      . nicotine (NICODERM CQ - dosed in mg/24 hours) patch 21 mg  21 mg Transdermal Daily Jenne Campus, MD   21 mg  at 04/26/15 0820  . sertraline (ZOLOFT) tablet 100 mg  100 mg Oral Daily Laverle Hobby, PA-C   100 mg at 04/26/15 0820  . traZODone (DESYREL) tablet 100 mg  100 mg Oral QHS Ursula Alert, MD   100 mg at 04/25/15 2120    Lab Results:  No results found for this or any previous visit (from the past 48 hour(s)).  Physical Findings: AIMS: Facial and Oral Movements Muscles of Facial Expression: None, normal Lips and Perioral Area: None, normal Jaw: None, normal Tongue: None, normal,Extremity Movements Upper (arms, wrists, hands, fingers): None, normal Lower (legs, knees, ankles, toes): None, normal, Trunk Movements Neck, shoulders, hips: None, normal, Overall Severity Severity of abnormal movements (highest score from questions above): None, normal Incapacitation due to abnormal movements: None, normal Patient's awareness of abnormal movements (rate only patient's report): No Awareness, Dental Status Current problems with teeth and/or dentures?: No Does patient usually wear dentures?: No  CIWA:  CIWA-Ar Total: 1 COWS:  COWS Total Score: 1  Musculoskeletal: Strength & Muscle Tone: within normal limits Gait & Station: normal Patient leans: N/A  Psychiatric Specialty Exam: Review of Systems  Constitutional: Negative.   HENT: Negative.   Respiratory: Negative.   Cardiovascular: Negative.   Gastrointestinal: Negative.   Genitourinary: Negative.   Musculoskeletal: Negative.   Skin: Negative.   Neurological: Negative.   Endo/Heme/Allergies: Negative.   Psychiatric/Behavioral: Positive for depression. The patient is nervous/anxious. The patient does not have insomnia.   All other systems reviewed and are negative.  no chest pain, no shortness of breath, no vomiting   Blood pressure 94/56, pulse 88, temperature 97.6 F (36.4 C), temperature source Oral, resp. rate 16.There is no height or weight on file to calculate BMI.  General Appearance: Fairly Groomed   Engineer, water::  Fair   Speech:  Normal Rate  Volume:  Normal  Mood:   Less depressed, improving  Affect:   Still constricted but more reactive, smiling more often  Thought Process:  Linear  Orientation:  Other:  fully alert and attentive   Thought Content:  denies halluicnations, no delusions, does not appear internally preoccupied   Suicidal Thoughts:  No at this time denies any suicidal ideations or any self injurious thoughts   Homicidal Thoughts:  No  At this time denies any violent ideations or HI towards step father or towards anyone else   Memory:   Recent and remote grossly intact   Judgement:   Improving   Insight:   Improving   Psychomotor Activity:  Normal  Concentration:  Good  Recall:  Good  Fund of Knowledge:Good  Language: Good  Akathisia:  Negative  Handed:  Right  AIMS (if indicated):  Assets:  Desire for Improvement Physical Health Resilience  ADL's:  Intact  Cognition: WNL  Sleep:  Number of Hours: 6   Assessment - patient presents with partial but significant improvement compared to initial presentation. Mood is improving and less depressed, affect is not irritable and seems somewhat less anxious. No longer endorsing any HI, and more future oriented, focusing on disposition planning. Thus far tolerating Zoloft/Depakote ER trial well.    Treatment Plan Summary: Daily contact with patient to assess and evaluate symptoms and progress in treatment, Medication management, Plan inpatient treatment and medications as below   Continue to encourage groups participation to address coping skills and symptom reduction Continue  Depakote ER   750 mg PO  QHS.  Depakote level on 04/27/15. Increase Zoloft to 150  mgrs QDAY for depression Continue  Trazodone 100 mgrs po QHS  for insomnia. Continue with Vistaril 25 mgrs Q 6 hours PRN Anxiety as needed  Treatment team working on disposition options  Neita Garnet  MD  04/26/2015, 2:49 PM

## 2015-04-26 NOTE — Progress Notes (Signed)
D: Pt is alert and oriented x 4. Pt continues denies any form of depression, anxiety, pain, SI, HI and AVH; states, "I hope I will be able to go home tomorrow?" Pt continues to have a flat affect; however, Pt was observed in dayroom have meaningful conversation with peers. Pt remained nonviolent, calm and cooperative through the shift assessment. A: Medications offered as prescribed.  Support, encouragement, and safe environment provided.  15-minute safety checks continue. R: Pt was med compliant.  Pt did not attend group. Safety checks continue

## 2015-04-27 LAB — VALPROIC ACID LEVEL: Valproic Acid Lvl: 56 ug/mL (ref 50.0–100.0)

## 2015-04-27 MED ORDER — NICOTINE 21 MG/24HR TD PT24: 21.0000 mg | MEDICATED_PATCH | Freq: Every day | TRANSDERMAL | Status: DC

## 2015-04-27 MED ORDER — HYDROXYZINE HCL 25 MG PO TABS: 25.0000 mg | ORAL_TABLET | Freq: Four times a day (QID) | ORAL | Status: DC | PRN

## 2015-04-27 MED ORDER — TRAZODONE HCL 100 MG PO TABS
100.0000 mg | ORAL_TABLET | Freq: Every day | ORAL | Status: DC
Start: 2015-04-27 — End: 2020-01-05

## 2015-04-27 MED ORDER — DIVALPROEX SODIUM ER 250 MG PO TB24: 750.0000 mg | ORAL_TABLET | Freq: Every day | ORAL | Status: DC

## 2015-04-27 MED ORDER — SERTRALINE HCL 50 MG PO TABS
150.0000 mg | ORAL_TABLET | Freq: Every day | ORAL | Status: DC
Start: 2015-04-27 — End: 2020-01-05

## 2015-04-27 NOTE — Progress Notes (Signed)
  [facility] :  Will you be returning to the same living situation after discharge:  No, Pt's mother working to find Pt housing At discharge, do you have transportation home?: Yes,  Pt's mother to provide transportation Do you have the ability to pay for your medications: Yes,  Pt provided with 7-day supply and prescriptions  Release of information consent forms completed and in the chart;  Patient's signature needed at discharge.  Patient to Follow up at: Follow-up Information    [facility]   Specialty:  Behavioral Health   Why:  Please walk-in between 8a-3p Monday-Friday within 7 days of your discharge to be set up with a doctor and therapist. It is suggested that you arrive early to avoid long wait times.   Contact information:   [address] [place] [address] [phone]       Next level of care provider has [facility] Link:no  Patient denies SI/HI: Yes,  Pt denies    Safety Planning and Suicide Prevention discussed: Yes,  with mother; see SPE note for further details    Has patient been referred to the Quitline?: Patient refused referral  Patient has been referred for addiction treatment: YesVesta Mixer- [name] for assessment  Dylan HoopsCarter, Dylan [name] 04/27/2015, 10:34 AM

## 2015-04-27 NOTE — [facility] Suicide Risk Assessment (Signed)
[facility] INPATIENT:  Family/Significant Other Suicide Prevention Education  Suicide Prevention Education:  Education Completed; Arnell SievingGloria Arant, Pt's mother [phone],  has been identified by the patient as the family member/significant other with whom the patient will be residing, and identified as the person(s) who will aid the patient in the event of a mental health crisis (suicidal ideations/suicide attempt).  With written consent from the patient, the family member/significant other has been provided the following suicide prevention education, prior to the and/or following the discharge of the patient.  The suicide prevention education provided includes the following:  Suicide risk factors  Suicide prevention and interventions  National Suicide Hotline telephone number  [facility] assessment telephone number  [facility] [place] Emergency Assistance 911  [facility] and/or Residential Mobile Crisis Unit telephone number  Request made of family/significant other to:  Remove weapons (e.g., guns, rifles, knives), all items previously/currently identified as safety concern.    Remove drugs/medications (over-the-counter, prescriptions, illicit drugs), all items previously/currently identified as a safety concern.  The family member/significant other verbalizes understanding of the suicide prevention education information provided.  The family member/significant other agrees to remove the items of safety concern listed above.  Elaina Hoopsarter, Charrisse Masley M 04/27/2015, 10:37 AM
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
## 2015-04-27 NOTE — [facility] Suicide Risk Assessment (Addendum)
[facility] Discharge Suicide Risk Assessment   Demographic Factors:  39 year old man,  Two children who live with their mother, currently homeless  Total Time spent with patient: 30 minutes  Musculoskeletal: Strength & Muscle Tone: within normal limits Gait & Station: normal Patient leans: N/A  Psychiatric Specialty Exam: Physical Exam  ROS  Blood pressure 106/59, pulse 90, temperature 98.3 F (36.8 C), temperature source Oral, resp. rate 18.There is no height or weight on file to calculate BMI.  General Appearance: Fairly Groomed  Patent attorney::  improved   Speech:  Normal Rate409  Volume:  Normal  Mood:  improved, less depressed  Affect:  Appropriate, less constricted, more reactive   Thought Process:  Irrelevant  Orientation:  Full (Time, Place, and Person)  Thought Content:  no hallucinations no delusions  Suicidal Thoughts:  No denies any suicidal ideations   Homicidal Thoughts:  No, specifically also denies any homicidal or violent ideations towards stepfather or anyone else   Memory:  recent and remote grossly intact  Judgement:  Other:  improved   Insight:  improved   Psychomotor Activity:  Normal  Concentration:  Good  Recall:  Good  Fund of Knowledge:Good  Language: Good  Akathisia:  Negative  Handed:  Right  AIMS (if indicated):     Assets:  Desire for Improvement Resilience  Sleep:  Number of Hours: 5.5  Cognition: WNL  ADL's:  Intact      Has this patient used any form of tobacco in the last 30 days? (Cigarettes, Smokeless Tobacco, Cigars, and/or Pipes) No  Mental Status Per Nursing Assessment::   On Admission:     Current Mental Status by Physician: At this time patient is improved compared to his admission. His mood is improved, he is less depressed, his range of affect is improved, no thought disorder, no hallucinations, no delusions, no suicidal ideations, and denies any homicidal ideations, to include any violent ideations towards step father  Loss  Factors: Poor relationship with step father, homelessness  Historical Factors: Prior psychiatric admissions, prior suicidal ideations.   Risk Reduction Factors:   Responsible for children under 25 years of age, Sense of responsibility to family, Employed and Positive coping skills or problem solving skills  Continued Clinical Symptoms:  As noted, currently improved , with improved mood, decreased depression, no SI, no HI. At this time future oriented, looking forward to seeing his children when he is discharged . Of note, Valproic Acid Serum level  56 on 12/13.   Cognitive Features That Contribute To Risk:  No gross cognitive deficits noted upon discharge. Is alert , attentive, and oriented x 3   Suicide Risk:  Mild:  Suicidal ideation of limited frequency, intensity, duration, and specificity.  There are no identifiable plans, no associated intent, mild dysphoria and related symptoms, good self-control (both objective and subjective assessment), few other risk factors, and identifiable protective factors, including available and accessible social support.  Principal Problem: Bipolar I disorder, most recent episode depressed [facility]) Discharge Diagnoses:  Patient Active Problem List   Diagnosis Date Noted  . Cannabis use disorder, moderate, dependence (HCC) [F12.20] 04/23/2015  . Bipolar I disorder, most recent episode depressed (HCC) [F31.30]   . Posttraumatic stress disorder [F43.10] 04/20/2015    Follow-up Information    Follow up with [facility] On 04/28/2015.   Specialty:  Behavioral Health   Why:  at 11:40am for medication management. At this appointment please tell the staff that you need to be scheduled for therapy as  well and they can make an appointment for those services.   Contact information:   [address] [place] [address] [phone]       Plan Of Care/Follow-up recommendations:  Activity:  as tolerated  Diet:  Regular Tests:  NA Other:  See below  Is  patient on multiple antipsychotic therapies at discharge:  No   Has Patient had three or more failed trials of antipsychotic monotherapy by history:  No  Recommended Plan for Multiple Antipsychotic Therapies: NA  Patient is being discharged in good spirits. Plans to live in shelter for a few days and then rent a room. Plans to follow up as above .  Dylan [name] 04/27/2015, 11:31 AM

## 2015-04-27 NOTE — Progress Notes (Signed)
Discharge note: Pt received both written and verbal discharge instructions. Pt verbalized understanding of discharge instructions. Pt agreed to f/u appt and med regimen. Pt received sample meds, prescriptions and belongings. Pt safely discharged to the lobby, at pt request.

## 2015-04-27 NOTE — Progress Notes (Signed)
Pt attended spiritual care group on grief and loss facilitated by chaplain Kevan Prouty  Group opened with brief discussion and psycho-social ed around grief and loss in relationships and in relation to self - identifying life patterns, circumstances, changes that cause losses. Established group norm of speaking from own life experience. Group goal of establishing open and affirming space for members to share loss and experience with grief, normalize grief experience and provide psycho social education and grief support. Group process drew on brief CBT, narrative and Adlerian models    

## 2015-04-27 NOTE — Tx Team (Signed)
Interdisciplinary Treatment Plan Update (Adult) Date: 04/27/2015   Date: 04/27/2015 10:27 AM  Progress in Treatment:  Attending groups: Yes Participating in groups: Yes Taking medication as prescribed: Yes  Tolerating medication: Yes  Family/Significant othe contact made: Yes, with mother Patient understands diagnosis: Continuing to assess Discussing patient identified problems/goals with staff: Yes  Medical problems stabilized or resolved: Yes  Denies suicidal/homicidal ideation: Yes Patient has not harmed self or Others: Yes   New problem(s) identified: None identified at this time.   Discharge Plan or Barriers: Pt will return home and follow-up with [facility]  Additional comments:  Patient and CSW reviewed pt's identified goals and treatment plan. Patient verbalized understanding and agreed to treatment plan. CSW reviewed [facility] "Discharge Process and Patient Involvement" Form. Pt verbalized understanding of information provided and signed form.   Reason for Continuation of Hospitalization:  Depression Medical Issues Medication stabilization Suicidal ideation  Estimated length of stay: 0 days; Pt stable for DC  Review of initial/current patient goals per problem list:   1.  Goal(s): Patient will participate in aftercare plan  Met:  Yes  Target date: 3-5 days from date of admission   As evidenced by: Patient will participate within aftercare plan AEB aftercare provider and housing plan at discharge being identified.  04/21/15: CSW to work with Pt to assess for appropriate discharge plan and faciliate appointments and referrals as needed prior to d/c. 04/26/15: Pt will return home and follow-up with [facility]  2.  Goal (s): Patient will exhibit decreased depressive symptoms and suicidal ideations.  Met:  Yes  Target date: 3-5 days from date of admission   As evidenced by: Patient will utilize self rating of depression at 3 or below and demonstrate decreased signs of  depression or be deemed stable for discharge by MD.  04/21/15: Pt rates depression at 6/10; denies SI  04/26/15: Pt rates depression at 2/10; denies SI  Attendees:  Patient:    Family:    Physician: Dr. Parke Poisson, MD  04/27/2015 10:27 AM  Nursing: Lars Pinks, RN Case manager  04/27/2015 10:27 AM  Clinical Social Worker Peri Maris, [place] 04/27/2015 10:27 AM  Other: Erasmo Downer Drinkard, [place] 04/27/2015 10:27 AM  Clinical: Marcella Dubs, RN 04/27/2015 10:27 AM  Other: , RN Charge Nurse 04/27/2015 10:27 AM  Other: Hilda Lias, [place], [place] Work [phone]

## 2015-04-27 NOTE — Plan of Care (Signed)
Problem: Diagnosis: Increased Risk For Suicide Attempt Goal: STG-Patient Will Comply With Medication Regime Outcome: Progressing Pt complaint with medication regime     

## 2018-12-09 ENCOUNTER — Other Ambulatory Visit: Payer: Self-pay

## 2018-12-09 DIAGNOSIS — Z20822 Contact with and (suspected) exposure to covid-19: Secondary | ICD-10-CM

## 2018-12-11 LAB — NOVEL CORONAVIRUS, NAA: SARS-CoV-2, NAA: NOT DETECTED

## 2019-10-14 ENCOUNTER — Ambulatory Visit (INDEPENDENT_AMBULATORY_CARE_PROVIDER_SITE_OTHER): Payer: No Payment, Other | Admitting: Licensed Clinical Social Worker

## 2019-10-14 ENCOUNTER — Other Ambulatory Visit: Payer: Self-pay

## 2019-10-14 DIAGNOSIS — F3130 Bipolar disorder, current episode depressed, mild or moderate severity, unspecified: Secondary | ICD-10-CM
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
## 2019-11-18 ENCOUNTER — Ambulatory Visit (INDEPENDENT_AMBULATORY_CARE_PROVIDER_SITE_OTHER): Payer: No Payment, Other | Admitting: Licensed Clinical Social Worker

## 2019-11-18 ENCOUNTER — Other Ambulatory Visit: Payer: Self-pay

## 2019-11-18 DIAGNOSIS — F3130 Bipolar disorder, current episode depressed, mild or moderate severity, unspecified: Secondary | ICD-10-CM | POA: Diagnosis not present

## 2019-11-18 NOTE — Patient Instructions (Signed)
Managing Bipolar Disorder When someone is diagnosed with bipolar disorder, the person may be relieved to now know why he or she has felt or behaved a certain way. The person may also feel overwhelmed about the treatment ahead, how to get needed support, and how to deal with the condition each day. With care and support, a person with bipolar disorder can learn to manage his or her symptoms and live with the condition. How to manage lifestyle changes Managing stress Stress is your body's reaction to life changes and events, both good and bad. Stress can play a major role in bipolar disorder, so it is important to learn how to manage stress. Some techniques to help you manage stress include:  Meditation, muscle relaxation, and breathing exercises.  Exercise. Even a short daily walk can help to lower stress levels.  Getting enough good-quality sleep. Too little sleep can cause mania to start (can trigger mania).  Making a schedule to manage your time. Knowing your daily schedule can help to keep you from feeling overwhelmed by tasks and deadlines.  Spending time on hobbies you enjoy.  Medicines Your health care provider may suggest certain medicines if he or she feels that they will help improve your condition. Avoid using caffeine, alcohol, and other substances that may prevent your medicines from working properly. It is also important to:  Talk with your pharmacist or health care provider about all the medicines that you take, their possible side effects, and which medicines are safe to take together.  Make it your goal to take part in all treatment decisions (shared decision-making). Ask about possible side effects of medicines that your health care provider recommends, and tell him or her how you feel about having those side effects. It is best if shared decision-making with your health care provider is part of your total treatment plan. If you are taking medicines as part of your treatment,  do not stop taking medicines before you ask your health care provider if it is safe to stop. You may need to have the medicine slowly decreased (tapered) over time to lower the risk of harmful side effects. Relationships Spend time with people whom you trust and with whom you feel a sense of understanding and calm. Try to find friends or family members who make you feel safe and can help you control feelings of mania. Consider going to couples counseling, family education classes, or family therapy to:  Educate your loved ones about your condition and offer suggestions about how they can support you.  Help resolve conflicts.  Help develop communication skills in your relationships.  How to recognize changes in your condition Everyone responds differently to treatment for bipolar disorder. Some signs that your condition is improving include:  Leveling of your mood. You may have less anger and excitement about daily activities, and your low moods may not be as bad.  Your symptoms being less intense.  Feeling calm more often.  Thinking clearly.  Not experiencing consequences for extreme behavior.  Feeling like your life is settling down.  Your behavior seeming more normal to you and to other people. Some signs that your condition may be getting worse include:  Sleep problems.  Moods cycling between deep lows and unusually high (excess) energy.  Extreme emotions.  More anger at loved ones.  Staying away from others, or isolating yourself.  A feeling of power or superiority.  Completing a lot of tasks in a very short amount of time.  Unusual thoughts   and behaviors.  Suicidal thoughts. Follow these instructions at home: Medicines  Take over-the-counter and prescription medicines only as told by your health care provider or pharmacist.  Ask your pharmacist what over-the-counter cold medicines you should avoid. Some medicines can make symptoms worse. General  instructions  Ask for support from trusted family members or friends to make sure you stay on track with your treatment.  Keep a journal to write down your daily moods, medicines, sleep habits, and life events. This may help you have more success with your treatment.  Make and follow a routine for daily meal times. Eat healthy foods, such as whole grains, vegetables, and fresh fruit.  Try to go to sleep and wake up around the same time every day.  Keep all follow-up visits as told by your health care provider. This is important. Where to find support Talking to others  Try making a list of the people you may want to tell about your condition, such as the people you trust most.  Plan what you are willing and not willing to talk about. Think about your needs ahead of time, and how your friends and family members can support you.  Let your loved ones know when they can share advice and when you would just like them to listen.  Give your loved ones information about bipolar disorder, and encourage them to learn about the condition. Finances Not all insurance plans cover mental health care, so it is important to check with your insurance carrier. If paying for co-pays or counseling services is a problem, search for a local or county mental health care center. Public mental health care services may be offered there at a low cost or no cost when you are not able to see a private health care provider. If you are taking medicine for depression, you may be able to get the generic form, which may be less expensive than brand-name medicine. Some makers of prescription medicines also offer help to patients who cannot afford the medicines they need. Questions to ask your health care provider:  If you are taking medicines: ? How long do I need to take medicine? ? Are there any long-term side effects of my medicine? ? Are there any alternatives to taking medicine?  How would I benefit from  therapy?  How often should I follow up with a health care provider? Contact a health care provider if:  Your symptoms get worse or they do not get better with treatment. Get help right away if:  You have thoughts about harming yourself or others. If you ever feel like you may hurt yourself or others, or have thoughts about taking your own life, get help right away. You can go to your nearest emergency department or call:  Your local emergency services (911 in the [place]  A suicide crisis helpline, such as the National Suicide Prevention Lifeline at 1-800-273-8255. This is open 24-hours a day. Summary  Learning ways to manage stress can help to calm you and may also help your treatment work better.  There is a wide range of medicines that can help to treat bipolar disorder.  Having healthy relationships can help to make your moods more stable.  Contact a health care provider if your symptoms get worse or they do not get better with treatment. This information is not intended to replace advice given to you by your health care provider. Make sure you discuss any questions you have with your health care provider. Document Revised:   08/23/2018 Document Reviewed: 08/31/2016 Elsevier Patient Education  2020 Elsevier Inc.  

## 2019-11-18 NOTE — Progress Notes (Signed)
   THERAPIST PROGRESS NOTE  Session Time: 60  Subjective: Pt throughout session explained frustration with medication regiment stating that he has been feeling different types of side effects from inconsistent appetite, tremors, reoccurring nightmare (pt reports this has not happened since he was a child). LCSW obtained meds that pt is currently taking as Depakote, Sertraline, Trazadone   Dylan [name] reports that he needs records sent to  [facility] supporting his treatment as he continues to struggle with child support payments, but has been unable to find steady employment due to his current mental health. Pt states that he continues to work with an attorney to get SSDI as pt has tried multiple times to get employment but says "Without income you cannot get gas or travel on the bus". SSDI is still pending review.  Pt does report thought that he still gets to see his son who is 48 years old 3 days weekly and is looking forward to celebrating his birthday later this week.  LCSW spoke with pt about homework assigned which was journals. Pt reports "I have only been able to write things down here and there" giving examples of things that he has observed such as sights and sounds he notices when he takes walks 2 times daily pending on the weather.    Objective: Dylan [name] was alert and oriented x 5. Pt was dressed casually with flat/anxious affect. Engaged well throughout assessment. Speech was coherent but slowed.   Assessment: Pt reports that he has increased symptoms on anxiety, inconsistent appetite, panic attacks, sweaty palms, and racing heartbeat, sadness, irritability, insomnia. Pt currently denies hallucination and delusions. On average pt reports he is getting 2 to 3 hours of sleep nightly due to racing thoughts. Pt continued to meet criteria for bipolar disorder. Next step for pt is to get established with [place] [facility] med mgmt[facility] team.    Plan:  Journal 1 x weekly, exercise 4 times weekly,  continue on medication regiment, write down 5 positive words of affirmation.   Participation Level: Active  Behavioral Response: Casual and Fairly GroomedAlertDepressed  Type of Therapy: Individual Therapy  Treatment Goals addressed: Diagnosis: bipolar  Interventions: CBT and Supportive  Summary: Dylan [name] is a 44 y.o. male who presents with bipolar disorder.   Suicidal/Homicidal: NAwithout intent/plan    Plan: Return again in 4 weeks.  Diagnosis: Axis I: Bipolar, Depressed       [name], LCSW 11/18/2019

## 2019-12-16 ENCOUNTER — Ambulatory Visit (INDEPENDENT_AMBULATORY_CARE_PROVIDER_SITE_OTHER): Payer: No Payment, Other | Admitting: Licensed Clinical Social Worker

## 2019-12-16 ENCOUNTER — Other Ambulatory Visit: Payer: Self-pay

## 2019-12-16 DIAGNOSIS — F3130 Bipolar disorder, current episode depressed, mild or moderate severity, unspecified: Secondary | ICD-10-CM

## 2019-12-16 NOTE — Progress Notes (Signed)
   THERAPIST PROGRESS NOTE  Session Time: 49  Therapist Response:    Subjective/objective:    Pt presented as alert and oriented x 5. He had flat blunted affect and was dressed casually. He engaged well throughout assessment.   Pt reports that he has trouble sleeping the past few weeks and has been very irritable reporting multiple mood swings per week. Last one reported was Sunday with his sons' mother pt reports that he will yell and scream but usually calms down after a few minutes. Ithiel primary goal is to better control his anger throughout his daily interactions. He was provided meditation video to try and use in the mornings.  Essam primary motivation has been to give his son the childhood he never had, he does try and speak with his eldest child who is currently 53 but has been unsuccessful maintain a relationship with her. Currently he is seeing his son 1 to 2 times weekly. Sayer wants to be a role model for his son but knows that he needs to get his mind right first.    Assessment/plan: Pt endorses symptoms of drowsiness, sadness, irritability, and mood swing. He has been taking medication daily. He currently meets the criteria for bipolar 1 disorder currently depressed. Plan moving forward will be mediation daily, follow up with medication mgmt. on 8/23, use music as coping mechanism when anger is present.   Behavioral Response: Casual and GuardedAlertDepressed and Irritable  Type of Therapy: Individual Therapy  Treatment Goals addressed: Anger and Anxiety  Interventions: CBT and Supportive  Summary: NELDON SHEPARD is a 44 y.o. male who presents with bipolar disorder.   Suicidal/Homicidal: NAwithout intent/plan   Plan: Return again in 4 weeks.  Diagnosis: Axis I: Bipolar, Depressed        [name], LCSW 12/16/2019

## 2020-01-05 ENCOUNTER — Encounter (HOSPITAL_COMMUNITY): Payer: Self-pay | Admitting: Psychiatry

## 2020-01-05 ENCOUNTER — Other Ambulatory Visit: Payer: Self-pay

## 2020-01-05 ENCOUNTER — Ambulatory Visit (INDEPENDENT_AMBULATORY_CARE_PROVIDER_SITE_OTHER): Payer: No Payment, Other | Admitting: Psychiatry

## 2020-01-05 DIAGNOSIS — F3130 Bipolar disorder, current episode depressed, mild or moderate severity, unspecified: Secondary | ICD-10-CM

## 2020-01-05 DIAGNOSIS — F1220 Cannabis dependence, uncomplicated: Secondary | ICD-10-CM | POA: Diagnosis not present

## 2020-01-05 DIAGNOSIS — F4310 Post-traumatic stress disorder, unspecified: Secondary | ICD-10-CM

## 2020-01-05 MED ORDER — HYDROXYZINE HCL 25 MG PO TABS: 25.0000 mg | ORAL_TABLET | Freq: Four times a day (QID) | ORAL | 2 refills | Status: DC | PRN

## 2020-01-05 MED ORDER — PRAZOSIN HCL 1 MG PO CAPS
1.0000 mg | ORAL_CAPSULE | Freq: Every day | ORAL | 2 refills | Status: DC
Start: 2020-01-05 — End: 2020-03-02

## 2020-01-05 MED ORDER — DIVALPROEX SODIUM ER 250 MG PO TB24: 750.0000 mg | ORAL_TABLET | Freq: Every day | ORAL | 2 refills | Status: DC

## 2020-01-05 MED ORDER — SERTRALINE HCL 50 MG PO TABS: 150.0000 mg | ORAL_TABLET | Freq: Every day | ORAL | 2 refills | Status: DC

## 2020-01-05 MED ORDER — TRAZODONE HCL 150 MG PO TABS: 150.0000 mg | ORAL_TABLET | Freq: Every day | ORAL | 2 refills | Status: DC
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
## 2020-01-23 ENCOUNTER — Other Ambulatory Visit: Payer: Self-pay

## 2020-01-23 ENCOUNTER — Ambulatory Visit (INDEPENDENT_AMBULATORY_CARE_PROVIDER_SITE_OTHER): Payer: No Payment, Other | Admitting: Licensed Clinical Social Worker

## 2020-01-23 DIAGNOSIS — F3162 Bipolar disorder, current episode mixed, moderate: Secondary | ICD-10-CM | POA: Diagnosis not present

## 2020-01-23 DIAGNOSIS — F4310 Post-traumatic stress disorder, unspecified: Secondary | ICD-10-CM

## 2020-01-23 NOTE — Progress Notes (Signed)
   THERAPIST PROGRESS NOTE  Session Time: 39  Therapist Response:   Subjective/objective: Pt was alert and oriented x 5. He presented today with flat/depressed mood/affect. He was well engaged as evidence by note below. Eye contact when talking was avoided as evidence by looking down toward floor.   Pt primary stressor has been his past. He reports that people continue to judge him for how he handled himself through adversity. Example of adversity pt provided was deaths of his grandfather, cousins death x 2, and being told not to attend his son's birth. He states that he did not handle this stuff well back in 2014. This is when he states that "I just turned it all off". Pt reports that he was not one for words, he would rather just fight it out. It took pt year to understand that this was not the way to handle himself.   Currently pt is working on a job at his apartment complex for remolding. It does give pt relief off his rent. 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 This has helped pt keep his mental health in good standing. He states that he gets to see his son multiple times per week, and they do things such as watch movies or play with action figures. Dylan [name] does not get to see his daughter very much as he states, "She is either working or traveling".   Pt reports coping skills as journaling daily, writing music weekly, exercising, and eating "clean". LCSW asked what he meant by him eating clean pt states "Eating more fish and less red meat". Dylan [name] states that he has been able to write some music but has not had the time to record anything due to the work he is doing at his apartment complex. He also wants to investigate tai chi classes, as this is something that has always interested him. Dylan [name] has been working on crocheting with his significant other even though he is not good at it, it keeps his hands busy.    Assessment/Plan: Pt endorses symptoms for rapid thoughts and  irritability for mania. Pt also reports depression for sadness but does not feel worthless or hopeless about life. Currently Pt does meet criteria for bipolar 1 mixed. Dylan [name] has been taking all medication as directed. Plan moving forward continue journaling daily but add in 3-5 positive adjectives. incorporate crocheting 2 x per week, exercise 4 x per week, and start working part time. Pt will continue plan to see LCSW 1 x monthly.    Participation Level: Active  Behavioral Response: Casual and Fairly GroomedAlertEuthymic  Type of Therapy: Individual Therapy  Treatment Goals addressed: Diagnosis: bipolar disorder   Interventions: CBT and Supportive  Summary: Dylan [name] is a 44 y.o. male who presents with bipolar disorder.   Suicidal/Homicidal: Nowithout intent/plan  Plan: Return again in 4 weeks.    Dory Horn, LCSW 01/23/2020

## 2020-03-02 ENCOUNTER — Encounter (HOSPITAL_COMMUNITY): Payer: Self-pay | Admitting: Psychiatry

## 2020-03-02 ENCOUNTER — Other Ambulatory Visit: Payer: Self-pay

## 2020-03-02 ENCOUNTER — Ambulatory Visit (INDEPENDENT_AMBULATORY_CARE_PROVIDER_SITE_OTHER): Payer: No Payment, Other | Admitting: Psychiatry

## 2020-03-02 DIAGNOSIS — F3130 Bipolar disorder, current episode depressed, mild or moderate severity, unspecified: Secondary | ICD-10-CM

## 2020-03-02 DIAGNOSIS — F4310 Post-traumatic stress disorder, unspecified: Secondary | ICD-10-CM

## 2020-03-02 MED ORDER — HYDROXYZINE HCL 25 MG PO TABS: 25.0000 mg | ORAL_TABLET | Freq: Four times a day (QID) | ORAL | 2 refills | Status: DC | PRN

## 2020-03-02 MED ORDER — TRAZODONE HCL 150 MG PO TABS: 150.0000 mg | ORAL_TABLET | Freq: Every day | ORAL | 2 refills | Status: DC

## 2020-03-02 MED ORDER — SERTRALINE HCL 50 MG PO TABS: 150.0000 mg | ORAL_TABLET | Freq: Every day | ORAL | 2 refills | Status: DC

## 2020-03-02 MED ORDER — PRAZOSIN HCL 1 MG PO CAPS: 1.0000 mg | ORAL_CAPSULE | Freq: Every day | ORAL | 2 refills | Status: DC

## 2020-03-02 MED ORDER — DIVALPROEX SODIUM ER 250 MG PO TB24: 750.0000 mg | ORAL_TABLET | Freq: Every day | ORAL | 2 refills | Status: DC

## 2020-03-02 NOTE — Progress Notes (Signed)
BH MD/PA/NP OP Progress Note  03/02/2020 4:29 PM Dylan [name]  MRN:  [id number]  Chief Complaint: Im tired. I just started working"  HPI: 44 year old male seen today for follow up  psychiatric evaluation. He has a psychiatric history of bipolar 1, PTSD, and cannabis use.  He is currently being managed on hydroxyzine 25 mg every 6 hours as needed, and Zoloft 150 mg daily, trazodone 150 mg nightly, prazosin 1 mg nightly, and Depakote 750 mg nightly.  Patient notes that his medications are effective in managing his psychiatric conditions.  Today he is well-groomed, pleasant, cooperative, and engaged in conversation.  He notes that at times he is anxious and depressed however reports that he is able to cope with it well.  He notes that he has more difficulty with anxiety in social settings because of his past trauma.  He notes that he is fearful of how people react to him and notes that he takes on others vibes.  Provider conducted a GAD-7 and patient scored a 9.    Patient notes that his anxiety is exacerbated by the mother of his son and people from his old neighborhood.  He notes that he tries to not deal with his child's mother's drauma and reports that he is attempting to avoid people from his past who he notes makes him irritable.  He informed Clinical research associate that he has done a good job not becoming aggressive with other people.  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  He notes he works from 4 AM to 1 PM and notes that this is tiring.  Patient asked provider to write a letter regarding his PTSD as working in large groups triggers his anxiety.  Provider wrote a letter for patient to give to his employers.  Today he denies mania, SI/HI/VH or paranoia.  Patient notes that he copes with his anxiety by using a punching bag, bowling, and changing his number.  He notes that he is happy and informed provider that he recently got engaged and notes that he and his fiance are  looking for wedding rings.  No medication changes made today.  Patient agreeable to continue medications as prescribed.  He will follow-up with outpatient counselor for therapy.  No other concerns noted at this time.   Visit Diagnosis:    ICD-10-CM   1. Bipolar I disorder, most recent episode depressed (HCC)  F31.30 divalproex (DEPAKOTE ER) 250 MG 24 hr tablet    hydrOXYzine (ATARAX/VISTARIL) 25 MG tablet    sertraline (ZOLOFT) 50 MG tablet    traZODone (DESYREL) 150 MG tablet  2. Posttraumatic stress disorder  F43.10 prazosin (MINIPRESS) 1 MG capsule    Past Psychiatric History: bipolar 1, PTSD, and cannabis use. Past Medical History:  Past Medical History:  Diagnosis Date  . Bipolar 1 disorder (HCC)   . PTSD (post-traumatic stress disorder)    History reviewed. No pertinent surgical history.  Family Psychiatric History: Son autism  Family History: History reviewed. No pertinent family history.  Social History:  Social History   Socioeconomic History  . Marital status: Single    Spouse name: Not on file  . Number of children: Not on file  . Years of education: Not on file  . Highest education level: Not on file  Occupational History  . Not on file  Tobacco Use  . Smoking status: Former Smoker    Packs/day: 1.00    Types: Cigars  Substance and Sexual Activity  .  Alcohol use: Yes  . Drug use: Yes    Types: Marijuana  . Sexual activity: Yes  Other Topics Concern  . Not on file  Social History Narrative  . Not on file   Social Determinants of Health   Financial Resource Strain:   . Difficulty of Paying Living Expenses: Not on file  Food Insecurity:   . Worried About Programme researcher, broadcasting/film/video in the Last Year: Not on file  . Ran Out of Food in the Last Year: Not on file  Transportation Needs:   . Lack of Transportation (Medical): Not on file  . Lack of Transportation (Non-Medical): Not on file  Physical Activity:   . Days of Exercise per Week: Not on file  .  Minutes of Exercise per Session: Not on file  Stress:   . Feeling of Stress : Not on file  Social Connections:   . Frequency of Communication with Friends and Family: Not on file  . Frequency of Social Gatherings with Friends and Family: Not on file  . Attends Religious Services: Not on file  . Active Member of Clubs or Organizations: Not on file  . Attends Banker Meetings: Not on file  . Marital Status: Not on file    Allergies:  Allergies  Allergen Reactions  . Pork-Derived Products Other (See Comments)    Doesn't eat pork    Metabolic Disorder Labs: No results found for: HGBA1C, MPG No results found for: PROLACTIN No results found for: CHOL, TRIG, HDL, CHOLHDL, VLDL, LDLCALC Lab Results  Component Value Date   TSH 2.422 04/24/2015    Therapeutic Level Labs: No results found for: LITHIUM Lab Results  Component Value Date   VALPROATE 56 04/27/2015   No components found for:  CBMZ  Current Medications: Current Outpatient Medications  Medication Sig Dispense Refill  . divalproex (DEPAKOTE ER) 250 MG 24 hr tablet Take 3 tablets (750 mg total) by mouth at bedtime. For mood stabilization 90 tablet 2  . hydrOXYzine (ATARAX/VISTARIL) 25 MG tablet Take 1 tablet (25 mg total) by mouth every 6 (six) hours as needed for anxiety. 90 tablet 2  . prazosin (MINIPRESS) 1 MG capsule Take 1 capsule (1 mg total) by mouth at bedtime. 30 capsule 2  . sertraline (ZOLOFT) 50 MG tablet Take 3 tablets (150 mg total) by mouth daily. For depression 90 tablet 2  . traZODone (DESYREL) 150 MG tablet Take 1 tablet (150 mg total) by mouth at bedtime. For insomnia 30 tablet 2   No current facility-administered medications for this visit.     Musculoskeletal: Strength & Muscle Tone: within normal limits Gait & Station: normal Patient leans: N/A  Psychiatric Specialty Exam: Review of Systems  There were no vitals taken for this visit.There is no height or weight on file to  calculate BMI.  General Appearance: Well Groomed  Eye Contact:  Good  Speech:  Clear and Coherent and Normal Rate  Volume:  Normal  Mood:  Euthymic  Affect:  Appropriate and Congruent  Thought Process:  Coherent, Goal Directed and Linear  Orientation:  Full (Time, Place, and Person)  Thought Content: Logical and Illogical   Suicidal Thoughts:  No  Homicidal Thoughts:  No  Memory:  Immediate;   Good Recent;   Good Remote;   Good  Judgement:  Good  Insight:  Good  Psychomotor Activity:  Normal  Concentration:  Concentration: Good and Attention Span: Good  Recall:  Good  Fund of Knowledge: Good  Language:  Good  Akathisia:  No  Handed:  Right  AIMS (if indicated): Good  Assets:  Communication Skills Desire for Improvement Financial Resources/Insurance Housing Intimacy Social Support  ADL's:  Intact  Cognition: WNL  Sleep:  Good   Screenings: AIMS     [facility]  AIMS Total Score 0    GAD-7     Clinical Support from 03/02/2020 in [facility]  Total GAD-7 Score 9    PHQ2-9     Counselor from 10/14/2019 in [facility]  PHQ-2 Total Score 4  PHQ-9 Total Score 20       Assessment and Plan: Patient reports that he is doing well on his current medication regimen.  He notes that he experiences anxiety and depression occasionally however would not like his medication change at this time.  He is agreeable to continue all medications as prescribed.  1. Bipolar I disorder, most recent episode depressed (HCC)  Continue- divalproex (DEPAKOTE ER) 250 MG 24 hr tablet; Take 3 tablets (750 mg total) by mouth at bedtime. For mood stabilization  Dispense: 90 tablet; Refill: 2 Continue- hydrOXYzine (ATARAX/VISTARIL) 25 MG tablet; Take 1 tablet (25 mg total) by mouth every 6 (six) hours as needed for anxiety.  Dispense: 90 tablet; Refill: 2 Continue-  sertraline (ZOLOFT) 50 MG tablet; Take 3 tablets (150 mg total) by mouth daily. For depression  Dispense: 90 tablet; Refill: 2 Continue- traZODone (DESYREL) 150 MG tablet; Take 1 tablet (150 mg total) by mouth at bedtime. For insomnia  Dispense: 30 tablet; Refill: 2  2. Posttraumatic stress disorder  Continue- prazosin (MINIPRESS) 1 MG capsule; Take 1 capsule (1 mg total) by mouth at bedtime.  Dispense: 30 capsule; Refill: 2  Follow-up in 3 months Follow-up with therapy   Shanna Cisco, NP 03/02/2020, 4:29 PM

## 2020-03-04 ENCOUNTER — Ambulatory Visit (HOSPITAL_COMMUNITY): Payer: No Payment, Other | Admitting: Licensed Clinical Social Worker

## 2020-03-23 ENCOUNTER — Other Ambulatory Visit: Payer: Self-pay

## 2020-03-23 ENCOUNTER — Ambulatory Visit (INDEPENDENT_AMBULATORY_CARE_PROVIDER_SITE_OTHER): Payer: No Payment, Other | Admitting: Licensed Clinical Social Worker

## 2020-03-23 DIAGNOSIS — F3130 Bipolar disorder, current episode depressed, mild or moderate severity, unspecified: Secondary | ICD-10-CM | POA: Diagnosis not present

## 2020-03-23 NOTE — Progress Notes (Signed)
   THERAPIST PROGRESS NOTE  Session Time: 30   Therapist Response:    Subjective/Objective:  Pt was alert and oriented x 5. He was dressed casually as evidence by pt being in work uniform. Dylan [name] presented today with flat, anxious and flat mood/affect. He was cooperative and maintained good eye contact.   Pt reports his primary stressor is his relationship. He has been unable to communicate that he does not want to talk about his past "street life". Dylan [name] reports that she was raised in a very different lifestyle. His significant other is only aware of pt past because of a mutual friend they both have. LCSW utilized motivation and solution focused therapy to role play with pt how he would communicate this with his spouse. Pt reports relief from intervention used.    Assessment/Plan:  Pt endorses symptoms for sadness, irritability, insomnia, tension and worry. Currently pt does meet criteria for bipolar disorder. He has been taking all his medication as prescribed. Plan for therapy is to meet 1 x monthly with LCSW. Pt will also continue to use coping skills for exercise, reading and writing as primary outlets for his anger. Dylan [name] will f/u in 8 weeks for LCSW next available time slot.  Participation Level: Active  Behavioral Response: Casual and Fairly GroomedAlertAnxious, Depressed and flat   Type of Therapy: Individual Therapy  Treatment Goals addressed: Diagnosis: bipolar 1 depression   Interventions: CBT and Supportive  Summary: Dylan [name] is a 44 y.o. male who presents with bipolar 1 therapy.   Suicidal/Homicidal: NAwithout intent/plan   Plan: Return again in  8 weeks.     [name], LCSW 03/23/2020

## 2020-05-20 ENCOUNTER — Ambulatory Visit (INDEPENDENT_AMBULATORY_CARE_PROVIDER_SITE_OTHER): Payer: No Payment, Other | Admitting: Licensed Clinical Social Worker

## 2020-05-20 ENCOUNTER — Other Ambulatory Visit: Payer: Self-pay

## 2020-05-20 DIAGNOSIS — F3130 Bipolar disorder, current episode depressed, mild or moderate severity, unspecified: Secondary | ICD-10-CM

## 2020-05-20 DIAGNOSIS — F4310 Post-traumatic stress disorder, unspecified: Secondary | ICD-10-CM
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
## 2020-06-01 ENCOUNTER — Telehealth (INDEPENDENT_AMBULATORY_CARE_PROVIDER_SITE_OTHER): Payer: No Payment, Other | Admitting: Psychiatry

## 2020-06-01 ENCOUNTER — Other Ambulatory Visit: Payer: Self-pay

## 2020-06-01 ENCOUNTER — Encounter (HOSPITAL_COMMUNITY): Payer: Self-pay | Admitting: Psychiatry

## 2020-06-01 DIAGNOSIS — F4310 Post-traumatic stress disorder, unspecified: Secondary | ICD-10-CM

## 2020-06-01 DIAGNOSIS — F3130 Bipolar disorder, current episode depressed, mild or moderate severity, unspecified: Secondary | ICD-10-CM

## 2020-06-01 MED ORDER — DIVALPROEX SODIUM ER 250 MG PO TB24: 750.0000 mg | ORAL_TABLET | Freq: Every day | ORAL | 2 refills | Status: DC

## 2020-06-01 MED ORDER — TRAZODONE HCL 150 MG PO TABS: 150.0000 mg | ORAL_TABLET | Freq: Every day | ORAL | 2 refills | Status: DC

## 2020-06-01 MED ORDER — HYDROXYZINE HCL 25 MG PO TABS: 25.0000 mg | ORAL_TABLET | Freq: Four times a day (QID) | ORAL | 2 refills | Status: DC | PRN

## 2020-06-01 MED ORDER — SERTRALINE HCL 50 MG PO TABS
150.0000 mg | ORAL_TABLET | Freq: Every day | ORAL | 2 refills | Status: DC
Start: 2020-06-01 — End: 2020-08-26

## 2020-06-01 MED ORDER — PRAZOSIN HCL 1 MG PO CAPS: 1.0000 mg | ORAL_CAPSULE | Freq: Every day | ORAL | 2 refills | Status: DC

## 2020-06-01 NOTE — Progress Notes (Signed)
BH MD/PA/NP OP Progress Note Virtual Visit via Telephone Note  I connected with Dylan [name] on 06/01/20 at  4:00 PM EST by telephone and verified that I am speaking with the correct person using two identifiers.  Location: Patient: home Provider: Clinic   I discussed the limitations, risks, security and privacy concerns of performing an evaluation and management service by telephone and the availability of in person appointments. I also discussed with the patient that there may be a patient responsible charge related to this service. The patient expressed understanding and agreed to proceed.   I provided 25 minutes of non-face-to-face time during this encounter.   06/01/2020 12:35 PM Dylan [name]  MRN:  [id number]  Chief Complaint: "Things are well. The meds are working"  HPI: 45 year old male seen today for follow up  psychiatric evaluation. He has a psychiatric history of bipolar 1, PTSD, and cannabis use.  He is currently being managed on hydroxyzine 25 mg every 6 hours as needed, and Zoloft 150 mg daily, trazodone 150 mg nightly, prazosin 1 mg nightly, and Depakote 750 mg nightly.  Patient notes that his medications are effective in managing his psychiatric conditions.  Today he is well-groomed, pleasant, cooperative, and engaged in conversation.  He notes that his medications are effective and notes he has minimal anxiety and depression. Provider conducted a GAD 7 and patient scored a 5, at last visit he scored a 9. Provider also conducted a PHQ 9 and patient scored a 4. He also endorses adequate sleep and had appetite is well. He denies SI/HI/VAH or paranoia.  Patient notes that he continues to have more anxiety at work. 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 Provider informed patient that he can reprint the letter written at his last visit by accessing my chart. He endorsed understanding and agreered.    Patient notes that he copes with his anxiety by using a punching bag, bowling, and changing his number.      No medication changes made today.  Patient agreeable to continue medications as prescribed.  He will follow-up with outpatient counselor for therapy.  No other concerns noted at this time.   Visit Diagnosis:    ICD-10-CM   1. Bipolar I disorder, most recent episode depressed (HCC)  F31.30 traZODone (DESYREL) 150 MG tablet    hydrOXYzine (ATARAX/VISTARIL) 25 MG tablet    divalproex (DEPAKOTE ER) 250 MG 24 hr tablet    sertraline (ZOLOFT) 50 MG tablet  2. Posttraumatic stress disorder  F43.10 prazosin (MINIPRESS) 1 MG capsule    Past Psychiatric History: bipolar 1, PTSD, and cannabis use. Past Medical History:  Past Medical History:  Diagnosis Date  . Bipolar 1 disorder (HCC)   . PTSD (post-traumatic stress disorder)    History reviewed. No pertinent surgical history.  Family Psychiatric History: Son autism  Family History: History reviewed. No pertinent family history.  Social History:  Social History   Socioeconomic History  . Marital status: Single    Spouse name: Not on file  . Number of children: Not on file  . Years of education: Not on file  . Highest education level: Not on file  Occupational History  . Not on file  Tobacco Use  . Smoking status: Former Smoker    Packs/day: 1.00    Types: Cigars  . Smokeless tobacco: Not on file  Substance and Sexual Activity  . Alcohol use: Yes  . Drug use:  Yes    Types: Marijuana  . Sexual activity: Yes  Other Topics Concern  . Not on file  Social History Narrative  . Not on file   Social Determinants of Health   Financial Resource Strain: Not on file  Food Insecurity: Not on file  Transportation Needs: Not on file  Physical Activity: Not on file  Stress: Not on file  Social Connections: Not on file    Allergies:  Allergies  Allergen Reactions  . Pork-Derived Products Other (See Comments)    Doesn't  eat pork    Metabolic Disorder Labs: No results found for: HGBA1C, MPG No results found for: PROLACTIN No results found for: CHOL, TRIG, HDL, CHOLHDL, VLDL, LDLCALC Lab Results  Component Value Date   TSH 2.422 04/24/2015    Therapeutic Level Labs: No results found for: LITHIUM Lab Results  Component Value Date   VALPROATE 56 04/27/2015   No components found for:  CBMZ  Current Medications: Current Outpatient Medications  Medication Sig Dispense Refill  . divalproex (DEPAKOTE ER) 250 MG 24 hr tablet Take 3 tablets (750 mg total) by mouth at bedtime. For mood stabilization 90 tablet 2  . hydrOXYzine (ATARAX/VISTARIL) 25 MG tablet Take 1 tablet (25 mg total) by mouth every 6 (six) hours as needed for anxiety. 90 tablet 2  . prazosin (MINIPRESS) 1 MG capsule Take 1 capsule (1 mg total) by mouth at bedtime. 30 capsule 2  . sertraline (ZOLOFT) 50 MG tablet Take 3 tablets (150 mg total) by mouth daily. For depression 90 tablet 2  . traZODone (DESYREL) 150 MG tablet Take 1 tablet (150 mg total) by mouth at bedtime. For insomnia 30 tablet 2   No current facility-administered medications for this visit.     Musculoskeletal: Strength & Muscle Tone: Unable to assess due to telephpone visit Gait & Station: Unable to assess due to telephone visit Patient leans: N/A  Psychiatric Specialty Exam: Review of Systems  There were no vitals taken for this visit.There is no height or weight on file to calculate BMI.  General Appearance: Unable to assess due to telephone visit  Eye Contact:  Unable to assess due to telephone visit  Speech:  Clear and Coherent and Normal Rate  Volume:  Normal  Mood:  Euthymic  Affect:  Appropriate and Congruent  Thought Process:  Coherent, Goal Directed and Linear  Orientation:  Full (Time, Place, and Person)  Thought Content: Logical and Illogical   Suicidal Thoughts:  No  Homicidal Thoughts:  No  Memory:  Immediate;   Good Recent;   Good Remote;    Good  Judgement:  Good  Insight:  Good  Psychomotor Activity:  Normal  Concentration:  Concentration: Good and Attention Span: Good  Recall:  Good  Fund of Knowledge: Good  Language: Good  Akathisia:  No  Handed:  Right  AIMS (if indicated): Good  Assets:  Communication Skills Desire for Improvement Financial Resources/Insurance Housing Intimacy Social Support  ADL's:  Intact  Cognition: WNL  Sleep:  Good   Screenings: AIMS   Flowsheet Row [facility]  AIMS Total Score 0    GAD-7   Flowsheet Row Video Visit from 06/01/2020 in [facility] Clinical Support from 03/02/2020 in [facility]  Total GAD-7 Score 5 9    PHQ2-9   Flowsheet Row Video Visit from 06/01/2020 in [facility] Counselor from 10/14/2019 in [facility]  PHQ-2 Total Score 1 4  PHQ-9 Total Score 4 20       Assessment and Plan: Patient reports that he is doing well on his current medication regimen.  No medication changes today. He is agreeable to continue all medications as prescribed.  1. Bipolar I disorder, most recent episode depressed (HCC)  Continue- divalproex (DEPAKOTE ER) 250 MG 24 hr tablet; Take 3 tablets (750 mg total) by mouth at bedtime. For mood stabilization  Dispense: 90 tablet; Refill: 2 Continue- hydrOXYzine (ATARAX/VISTARIL) 25 MG tablet; Take 1 tablet (25 mg total) by mouth every 6 (six) hours as needed for anxiety.  Dispense: 90 tablet; Refill: 2 Continue- sertraline (ZOLOFT) 50 MG tablet; Take 3 tablets (150 mg total) by mouth daily. For depression  Dispense: 90 tablet; Refill: 2 Continue- traZODone (DESYREL) 150 MG tablet; Take 1 tablet (150 mg total) by mouth at bedtime. For insomnia  Dispense: 30 tablet; Refill: 2  2. Posttraumatic stress disorder  Continue- prazosin (MINIPRESS) 1 MG capsule; Take 1  capsule (1 mg total) by mouth at bedtime.  Dispense: 30 capsule; Refill: 2  Follow-up in 3 months Follow-up with therapy   Shanna Cisco, NP 06/01/2020, 12:35 PM

## 2020-06-24 ENCOUNTER — Other Ambulatory Visit: Payer: Self-pay

## 2020-06-24 ENCOUNTER — Ambulatory Visit (INDEPENDENT_AMBULATORY_CARE_PROVIDER_SITE_OTHER): Payer: No Payment, Other | Admitting: Licensed Clinical Social Worker

## 2020-06-24 DIAGNOSIS — F3130 Bipolar disorder, current episode depressed, mild or moderate severity, unspecified: Secondary | ICD-10-CM

## 2020-06-24 DIAGNOSIS — F4310 Post-traumatic stress disorder, unspecified: Secondary | ICD-10-CM
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
## 2020-07-22 ENCOUNTER — Other Ambulatory Visit: Payer: Self-pay

## 2020-07-22 ENCOUNTER — Ambulatory Visit (INDEPENDENT_AMBULATORY_CARE_PROVIDER_SITE_OTHER): Payer: No Payment, Other | Admitting: Licensed Clinical Social Worker

## 2020-07-22 DIAGNOSIS — F3130 Bipolar disorder, current episode depressed, mild or moderate severity, unspecified: Secondary | ICD-10-CM | POA: Diagnosis not present
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
## 2020-08-19 ENCOUNTER — Other Ambulatory Visit: Payer: Self-pay

## 2020-08-19 ENCOUNTER — Encounter (HOSPITAL_COMMUNITY): Payer: Self-pay | Admitting: Licensed Clinical Social Worker

## 2020-08-19 ENCOUNTER — Ambulatory Visit (INDEPENDENT_AMBULATORY_CARE_PROVIDER_SITE_OTHER): Payer: No Payment, Other | Admitting: Licensed Clinical Social Worker

## 2020-08-19 DIAGNOSIS — F3130 Bipolar disorder, current episode depressed, mild or moderate severity, unspecified: Secondary | ICD-10-CM

## 2020-08-19 DIAGNOSIS — F411 Generalized anxiety disorder: Secondary | ICD-10-CM

## 2020-08-19 NOTE — Progress Notes (Signed)
   THERAPIST PROGRESS NOTE  Session Time: 67    Therapist Response:    Subjective/Objective:   Pt was alert and oriented x 5. He was dressed casually and engaged well in therapy. Pt presented anxious mood/. affect. He was cooperative and maintained good eye contact.   Primary stressor for pt was work. He states that his company asks him to do more than what he can give. Dylan [name] reports this is due to staffing. 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 His supervisor is aware he needs to more help, but pt is still short staffed. Dylan [name] states that things have been going better with the mother of his son. He has been doing his best to stay focused on his sons needs and limiting contact with his mother beyond that. Pt continues to work out 7 days per week objective was 4 out of 7. He scored a 9 on his PHQ-9 objective is below 5.    Assessment: Pt endorses symptoms for tension worry, restlessness, and sleep. He currently meets criteria for GAD. Pt has been taking medications as prescribed. Dylan [name] will continue to f/u with LCSW every 4 weeks.   Participation Level: Active  Behavioral Response: CasualAlertAnxious  Type of Therapy: Individual Therapy  Treatment Goals addressed: Diagnosis: depression and anxiety   Interventions: CBT and Supportive  Summary: Dylan [name] is a 45 y.o. male who presents with bipolar depression and PTSD.   Suicidal/Homicidal: NAwithout intent/plan    Plan: Return again in 4 weeks.      [name], LCSW 08/19/2020

## 2020-08-26 ENCOUNTER — Other Ambulatory Visit: Payer: Self-pay

## 2020-08-26 ENCOUNTER — Ambulatory Visit (INDEPENDENT_AMBULATORY_CARE_PROVIDER_SITE_OTHER): Payer: No Payment, Other | Admitting: Psychiatry

## 2020-08-26 ENCOUNTER — Encounter (HOSPITAL_COMMUNITY): Payer: Self-pay | Admitting: Psychiatry

## 2020-08-26 DIAGNOSIS — F4310 Post-traumatic stress disorder, unspecified: Secondary | ICD-10-CM

## 2020-08-26 DIAGNOSIS — F3130 Bipolar disorder, current episode depressed, mild or moderate severity, unspecified: Secondary | ICD-10-CM | POA: Diagnosis not present

## 2020-08-26 MED ORDER — HYDROXYZINE HCL 25 MG PO TABS: 25.0000 mg | ORAL_TABLET | Freq: Four times a day (QID) | ORAL | 2 refills | Status: DC | PRN

## 2020-08-26 MED ORDER — PRAZOSIN HCL 1 MG PO CAPS: 1.0000 mg | ORAL_CAPSULE | Freq: Every day | ORAL | 2 refills | Status: DC

## 2020-08-26 MED ORDER — DIVALPROEX SODIUM ER 250 MG PO TB24: 750.0000 mg | ORAL_TABLET | Freq: Every day | ORAL | 2 refills | Status: DC

## 2020-08-26 MED ORDER — SERTRALINE HCL 50 MG PO TABS: 150.0000 mg | ORAL_TABLET | Freq: Every day | ORAL | 2 refills | Status: DC

## 2020-08-26 MED ORDER — TRAZODONE HCL 150 MG PO TABS: 150.0000 mg | ORAL_TABLET | Freq: Every day | ORAL | 2 refills | Status: DC

## 2020-08-26 NOTE — Progress Notes (Signed)
BH MD/PA/NP OP Progress Note    08/26/2020 9:19 AM Dylan [name]  MRN:  [id number]  Chief Complaint: "Work irritates me"  HPI: 45 year old male seen today for follow up  psychiatric evaluation. He has a psychiatric history of bipolar 1, PTSD, and cannabis use.  He is currently being managed on hydroxyzine 25 mg every 6 hours as needed, and Zoloft 150 mg daily, trazodone 150 mg nightly, prazosin 1 mg nightly, and Depakote 750 mg nightly.  Patient notes that his medications are effective in managing his psychiatric conditions.  Today he is well-groomed, pleasant, cooperative, and engaged in conversation.  Informed writer that since his last visit his anxiety and depression has somewhat worsened.  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  He notes that he tries to stay away from his peers or superiors because when they ask more of him he becomes irritated.  He also informed Clinical research associate that recently he became irritated with the mother of his child's boyfriend.  He notes that the gentleman was drunk and verbally aggressive.  He informed Clinical research associate that he almost got into an altercation with him.  Provider informed patient that he should consider the consequences of his actions if he did get into an altercation with this individual.  He endorsed understanding and notes that he will try to avoid conflict in the future as he wants to set a good example for his son and be in his son's life.    Patient notes that the above stressors exacerbate his anxiety and depression.  Today provider conducted GAD-7 and patient scored a 15, at his last visit he scored a 5.  Provider also conducted a PHQ-9 and patient scored a 10, at his last visit he scored a 4.   He also endorses adequate sleep and had appetite is well. He denies SI/HI/VAH or paranoia.  Patient informed Clinical research associate that he and his fiance are still doing well.  He  notes that they are planning to get married soon and the court house.  Patient notes that although he experiences the above stresses his medications are effective and would not like them adjusted today. No medication changes made today.  Patient agreeable to continue medications as prescribed.  He will follow-up with outpatient counselor for therapy.  No other concerns noted at this time.   Visit Diagnosis:    ICD-10-CM   1. Bipolar I disorder, most recent episode depressed (HCC)  F31.30 divalproex (DEPAKOTE ER) 250 MG 24 hr tablet    hydrOXYzine (ATARAX/VISTARIL) 25 MG tablet    sertraline (ZOLOFT) 50 MG tablet    traZODone (DESYREL) 150 MG tablet  2. Posttraumatic stress disorder  F43.10 prazosin (MINIPRESS) 1 MG capsule    Past Psychiatric History: bipolar 1, PTSD, and cannabis use. Past Medical History:  Past Medical History:  Diagnosis Date  . Bipolar 1 disorder (HCC)   . PTSD (post-traumatic stress disorder)    No past surgical history on file.  Family Psychiatric History: Son autism  Family History: No family history on file.  Social History:  Social History   Socioeconomic History  . Marital status: Single    Spouse name: Not on file  . Number of children: Not on file  . Years of education: Not on file  . Highest education level: Not on file  Occupational History  . Not on file  Tobacco Use  .  Smoking status: Former Smoker    Packs/day: 1.00    Types: Cigars  . Smokeless tobacco: Never Used  Substance and Sexual Activity  . Alcohol use: Yes    Alcohol/week: 2.0 standard drinks    Types: 2 Cans of beer per week  . Drug use: Not Currently  . Sexual activity: Yes    Partners: Female  Other Topics Concern  . Not on file  Social History Narrative  . Not on file   Social Determinants of Health   Financial Resource Strain: Low Risk   . Difficulty of Paying Living Expenses: Not very hard  Food Insecurity: No Food Insecurity  . Worried About Brewing technologist in the Last Year: Never true  . Ran Out of Food in the Last Year: Never true  Transportation Needs: No Transportation Needs  . Lack of Transportation (Medical): No  . Lack of Transportation (Non-Medical): No  Physical Activity: Sufficiently Active  . Days of Exercise per Week: 7 days  . Minutes of Exercise per Session: 50 min  Stress: Stress Concern Present  . Feeling of Stress : Rather much  Social Connections: Moderately Isolated  . Frequency of Communication with Friends and Family: Three times a week  . Frequency of Social Gatherings with Friends and Family: Once a week  . Attends Religious Services: Never  . Active Member of Clubs or Organizations: No  . Attends Banker Meetings: Never  . Marital Status: Living with partner    Allergies:  Allergies  Allergen Reactions  . Pork-Derived Products Other (See Comments)    Doesn't eat pork    Metabolic Disorder Labs: No results found for: HGBA1C, MPG No results found for: PROLACTIN No results found for: CHOL, TRIG, HDL, CHOLHDL, VLDL, LDLCALC Lab Results  Component Value Date   TSH 2.422 04/24/2015    Therapeutic Level Labs: No results found for: LITHIUM Lab Results  Component Value Date   VALPROATE 56 04/27/2015   No components found for:  CBMZ  Current Medications: Current Outpatient Medications  Medication Sig Dispense Refill  . divalproex (DEPAKOTE ER) 250 MG 24 hr tablet Take 3 tablets (750 mg total) by mouth at bedtime. For mood stabilization 90 tablet 2  . hydrOXYzine (ATARAX/VISTARIL) 25 MG tablet Take 1 tablet (25 mg total) by mouth every 6 (six) hours as needed for anxiety. 90 tablet 2  . prazosin (MINIPRESS) 1 MG capsule Take 1 capsule (1 mg total) by mouth at bedtime. 30 capsule 2  . sertraline (ZOLOFT) 50 MG tablet Take 3 tablets (150 mg total) by mouth daily. For depression 90 tablet 2  . traZODone (DESYREL) 150 MG tablet Take 1 tablet (150 mg total) by mouth at bedtime. For insomnia 30  tablet 2   No current facility-administered medications for this visit.     Musculoskeletal: Strength & Muscle Tone: within normal limits Gait & Station: normal Patient leans: N/A  Psychiatric Specialty Exam: Review of Systems  There were no vitals taken for this visit.There is no height or weight on file to calculate BMI.  General Appearance: Well Groomed  Eye Contact:  Good  Speech:  Clear and Coherent and Normal Rate  Volume:  Normal  Mood:  Anxious and Depressed  Affect:  Appropriate and Congruent  Thought Process:  Coherent, Goal Directed and Linear  Orientation:  Full (Time, Place, and Person)  Thought Content: Logical and Illogical   Suicidal Thoughts:  No  Homicidal Thoughts:  No  Memory:  Immediate;  Good Recent;   Good Remote;   Good  Judgement:  Good  Insight:  Good  Psychomotor Activity:  Normal  Concentration:  Concentration: Good and Attention Span: Good  Recall:  Good  Fund of Knowledge: Good  Language: Good  Akathisia:  No  Handed:  Right  AIMS (if indicated): Good  Assets:  Communication Skills Desire for Improvement Financial Resources/Insurance Housing Intimacy Social Support  ADL's:  Intact  Cognition: WNL  Sleep:  Good   Screenings: AIMS   Flowsheet Row [facility]  AIMS Total Score 0    GAD-7   Flowsheet Row Office Visit from 08/26/2020 in [facility] Video Visit from 06/01/2020 in [facility] Clinical Support from 03/02/2020 in [facility]  Total GAD-7 Score 15 5 9     PHQ2-9   Flowsheet Row Office Visit from 08/26/2020 in [facility] Counselor from 08/19/2020 in [facility] Video Visit from 06/01/2020 in [facility] Counselor from 10/14/2019 in [place]  PHQ-2 Total  Score 2 2 1 4   PHQ-9 Total Score 10 9 4 20     Flowsheet Row Office Visit from 08/26/2020 in [facility] Counselor from 08/19/2020 in [facility]  C-SSRS RISK CATEGORY No Risk No Risk       Assessment and Plan: Patient reports his anxiety and depression has worsened due to life stressors. Patient notes that although he experiences stress his medications are effective and would not like them adjusted today. No medication changes made today.  Patient agreeable to continue medications as prescribed 1. Bipolar I disorder, most recent episode depressed (HCC)  Continue- divalproex (DEPAKOTE ER) 250 MG 24 hr tablet; Take 3 tablets (750 mg total) by mouth at bedtime. For mood stabilization  Dispense: 90 tablet; Refill: 2 Continue- hydrOXYzine (ATARAX/VISTARIL) 25 MG tablet; Take 1 tablet (25 mg total) by mouth every 6 (six) hours as needed for anxiety.  Dispense: 90 tablet; Refill: 2 Continue- sertraline (ZOLOFT) 50 MG tablet; Take 3 tablets (150 mg total) by mouth daily. For depression  Dispense: 90 tablet; Refill: 2 Continue- traZODone (DESYREL) 150 MG tablet; Take 1 tablet (150 mg total) by mouth at bedtime. For insomnia  Dispense: 30 tablet; Refill: 2  2. Posttraumatic stress disorder  Continue- prazosin (MINIPRESS) 1 MG capsule; Take 1 capsule (1 mg total) by mouth at bedtime.  Dispense: 30 capsule; Refill: 2  Follow-up in 3 months Follow-up with therapy   [facility], NP 08/26/2020, 9:19 AM

## 2020-09-22 ENCOUNTER — Ambulatory Visit (INDEPENDENT_AMBULATORY_CARE_PROVIDER_SITE_OTHER): Payer: No Payment, Other | Admitting: Licensed Clinical Social Worker

## 2020-09-22 ENCOUNTER — Encounter (HOSPITAL_COMMUNITY): Payer: Self-pay | Admitting: Licensed Clinical Social Worker

## 2020-09-22 ENCOUNTER — Other Ambulatory Visit: Payer: Self-pay

## 2020-09-22 DIAGNOSIS — F3130 Bipolar disorder, current episode depressed, mild or moderate severity, unspecified: Secondary | ICD-10-CM | POA: Diagnosis not present

## 2020-09-22 NOTE — Progress Notes (Signed)
   THERAPIST PROGRESS NOTE  Session Time: 71   Participation Level: Active  Behavioral Response: Casual and Fairly GroomedAlertAnxious and Depressed  Type of Therapy: Individual Therapy  Treatment Goals addressed: Diagnosis: bipolar depression   Interventions: CBT and Supportive  Summary: Dylan [name] is a 45 y.o. male who presents with bipolar depression.   Suicidal/Homicidal: NAwithout intent/plan  Therapist Response:    Subjective/Objective:  Pt was alert and oriented x 5. He was dressed casually and engaged well in therapy session. Pt presented with anxious mood/affect. He was cooperative and maintained good eye contact.   Primary stressor for pt is work. He works part time at [employer]. Dylan [name] states they are still understaffed, and they are struggling with the work the supervisors ask them to do. Dylan [name] has asked for a 2nd shift work differential, and it has been denied. He reports that this upsets him. He got into a verbal altercation with management. This was later discussed in private with the manager 1 on 1 and it was resolved.   Plan: Pt is now attempting bar tending classes. His goal/objective is to finish those and quit [employer] once completed. He states he has a few more weeks left to go. He is working out 3 x per week and spending weekends with his son.    Assessment: Pt endorses tension, worry, irritability, mood swings, restlessness. He currently meets criteria for bipolar 1 depressed and GAD. He states he is taking his medications as prescribed. Pt will continue to f/u with LCSW every 4 weeks.     Plan: Return again in 4 weeks.     [name], LCSW 09/22/2020

## 2020-10-26 ENCOUNTER — Other Ambulatory Visit: Payer: Self-pay

## 2020-10-26 ENCOUNTER — Ambulatory Visit (INDEPENDENT_AMBULATORY_CARE_PROVIDER_SITE_OTHER): Payer: No Payment, Other | Admitting: Licensed Clinical Social Worker

## 2020-10-26 DIAGNOSIS — F3130 Bipolar disorder, current episode depressed, mild or moderate severity, unspecified: Secondary | ICD-10-CM | POA: Diagnosis not present

## 2020-10-26 DIAGNOSIS — F4310 Post-traumatic stress disorder, unspecified: Secondary | ICD-10-CM

## 2020-10-26 NOTE — Progress Notes (Signed)
   THERAPIST PROGRESS NOTE  Session Time: 12   Participation Level: Active  Behavioral Response: CasualAlertAnxious and Depressed  Type of Therapy: Individual Therapy  Treatment Goals addressed: Coping  Interventions: CBT and Supportive  Summary: NAVY BELAY is a 45 y.o. male who presents with Bipolar disorder and and PTSD.   Suicidal/Homicidal: NAwithout intent/plan  Therapist Response:   Therapist Response:    Subjective/Objective:  Pt was alert and oriented x 5. He was dressed casually and engaged well in therapy session. Pt presented with euthymic mood/affect He was cooperative and maintained good eye contact.   Pt primary stressor is work. He states they still have been requiring him to do more with limited support/help. Rishaan has attempted to do his best at his job, at times it does not seem to be enough.   Plan/intervention: LCSW utilized supportive therapy in today's session. LCSW reinforced positive feed back in client. He reported relief from session. Pt utilized positive self-talk in session. He continues to utilize working as his primary coping mechanism.    Assessment/Plan: Pt endorses symptoms for irritability, fatigue, and anger/mood swings. He meets criteria for Bipolar depression. He is taking his medications as prescribed. He will f/u in 4 weeks with LCSW.   Plan: Return again in 4  weeks.     [name], LCSW 10/26/2020

## 2020-11-22 ENCOUNTER — Ambulatory Visit (INDEPENDENT_AMBULATORY_CARE_PROVIDER_SITE_OTHER): Payer: No Payment, Other | Admitting: Psychiatry

## 2020-11-22 ENCOUNTER — Encounter (HOSPITAL_COMMUNITY): Payer: Self-pay | Admitting: Psychiatry

## 2020-11-22 ENCOUNTER — Other Ambulatory Visit: Payer: Self-pay

## 2020-11-22 DIAGNOSIS — F3130 Bipolar disorder, current episode depressed, mild or moderate severity, unspecified: Secondary | ICD-10-CM

## 2020-11-22 DIAGNOSIS — F4310 Post-traumatic stress disorder, unspecified: Secondary | ICD-10-CM

## 2020-11-22 MED ORDER — TRAZODONE HCL 150 MG PO TABS: 150.0000 mg | ORAL_TABLET | Freq: Every day | ORAL | 2 refills | Status: DC

## 2020-11-22 MED ORDER — SERTRALINE HCL 50 MG PO TABS: 150.0000 mg | ORAL_TABLET | Freq: Every day | ORAL | 2 refills | Status: DC

## 2020-11-22 MED ORDER — PRAZOSIN HCL 1 MG PO CAPS: 1.0000 mg | ORAL_CAPSULE | Freq: Every day | ORAL | 2 refills | Status: DC

## 2020-11-22 MED ORDER — HYDROXYZINE HCL 25 MG PO TABS: 25.0000 mg | ORAL_TABLET | Freq: Four times a day (QID) | ORAL | 2 refills | Status: DC | PRN

## 2020-11-22 MED ORDER — DIVALPROEX SODIUM ER 250 MG PO TB24: 750.0000 mg | ORAL_TABLET | Freq: Every day | ORAL | 2 refills | Status: DC

## 2020-11-22 NOTE — Progress Notes (Signed)
BH MD/PA/NP OP Progress Note    11/22/2020 11:00 AM Dylan [name]  MRN:  [id number]  Chief Complaint: "Thing are all good"  HPI: 45 year old male seen today for follow up  psychiatric evaluation. He has a psychiatric history of bipolar 1, PTSD, and cannabis use.  He is currently being managed on hydroxyzine 25 mg every 6 hours as needed, and Zoloft 150 mg daily, trazodone 150 mg nightly, prazosin 1 mg nightly, and Depakote 750 mg nightly.  Patient notes that his medications are effective in managing his psychiatric conditions.   Today he is well-groomed, pleasant, cooperative, and engaged in conversation.  He informed Clinical research associate that overall he is doing well.  He notes that he has been spending time with his son who recently had his ninth birthday.  He notes that he plans to spend time with his daughter today.  He informed Clinical research associate that she is considering going to the [employer] pursue a career in wrestling.  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  He informed Clinical research associate that most of his past coworkers have left their position and he is currently only one working.  Patient notes that he is preparing to go to court for child support hearing.  He notes that he does not feel that his child support will be raised as his son receives two checks her disability twice a week.  He informed Clinical research associate that his anxiety and his depression continues to be minimal.  Today provider conducted a GAD-7 and patient scored a 10, at his last visit he scored a 15.  Provider also conducted a PHQ-9 and patient scored a 10, at his last visit he scored 10.  He endorses adequate sleep and appetite.  Today he denies SI/HI/VAH, mania, or paranoia.  No medication changes made today.  Patient agreeable to continue medications as prescribed.  He will follow-up with outpatient counselor for therapy.  No other concerns noted at this time.    Visit Diagnosis:    ICD-10-CM   1. Bipolar I  disorder, most recent episode depressed (HCC)  F31.30 divalproex (DEPAKOTE ER) 250 MG 24 hr tablet    hydrOXYzine (ATARAX/VISTARIL) 25 MG tablet    sertraline (ZOLOFT) 50 MG tablet    traZODone (DESYREL) 150 MG tablet    2. Posttraumatic stress disorder  F43.10 prazosin (MINIPRESS) 1 MG capsule      Past Psychiatric History: bipolar 1, PTSD, and cannabis use. Past Medical History:  Past Medical History:  Diagnosis Date   Bipolar 1 disorder (HCC)    PTSD (post-traumatic stress disorder)    No past surgical history on file.  Family Psychiatric History: Son autism  Family History: No family history on file.  Social History:  Social History   Socioeconomic History   Marital status: Single    Spouse name: Not on file   Number of children: Not on file   Years of education: Not on file   Highest education level: Not on file  Occupational History   Not on file  Tobacco Use   Smoking status: Former    Packs/day: 1.00    Pack years: 0.00    Types: Cigars, Cigarettes   Smokeless tobacco: Never  Substance and Sexual Activity   Alcohol use: Not Currently    Comment: has not drank in over 2 week when he does it is 1 to 2 beers per week    Drug use: Not Currently  Sexual activity: Yes    Partners: Female  Other Topics Concern   Not on file  Social History Narrative   Not on file   Social Determinants of Health   Financial Resource Strain: Low Risk    Difficulty of Paying Living Expenses: Not very hard  Food Insecurity: No Food Insecurity   Worried About Programme researcher, broadcasting/film/video in the Last Year: Never true   Ran Out of Food in the Last Year: Never true  Transportation Needs: No Transportation Needs   Lack of Transportation (Medical): No   Lack of Transportation (Non-Medical): No  Physical Activity: Sufficiently Active   Days of Exercise per Week: 7 days   Minutes of Exercise per Session: 50 min  Stress: No Stress Concern Present   Feeling of Stress : Only a little  Social  Connections: Moderately Isolated   Frequency of Communication with Friends and Family: Three times a week   Frequency of Social Gatherings with Friends and Family: Once a week   Attends Religious Services: Never   Database administrator or Organizations: No   Attends Banker Meetings: Never   Marital Status: Living with partner    Allergies:  Allergies  Allergen Reactions   Pork-Derived Products Other (See Comments)    Doesn't eat pork    Metabolic Disorder Labs: No results found for: HGBA1C, MPG No results found for: PROLACTIN No results found for: CHOL, TRIG, HDL, CHOLHDL, VLDL, LDLCALC Lab Results  Component Value Date   TSH 2.422 04/24/2015    Therapeutic Level Labs: No results found for: LITHIUM Lab Results  Component Value Date   VALPROATE 56 04/27/2015   No components found for:  CBMZ  Current Medications: Current Outpatient Medications  Medication Sig Dispense Refill   divalproex (DEPAKOTE ER) 250 MG 24 hr tablet Take 3 tablets (750 mg total) by mouth at bedtime. For mood stabilization 90 tablet 2   hydrOXYzine (ATARAX/VISTARIL) 25 MG tablet Take 1 tablet (25 mg total) by mouth every 6 (six) hours as needed for anxiety. 90 tablet 2   prazosin (MINIPRESS) 1 MG capsule Take 1 capsule (1 mg total) by mouth at bedtime. 30 capsule 2   sertraline (ZOLOFT) 50 MG tablet Take 3 tablets (150 mg total) by mouth daily. For depression 90 tablet 2   traZODone (DESYREL) 150 MG tablet Take 1 tablet (150 mg total) by mouth at bedtime. For insomnia 30 tablet 2   No current facility-administered medications for this visit.     Musculoskeletal: Strength & Muscle Tone: within normal limits Gait & Station: normal Patient leans: N/A  Psychiatric Specialty Exam: Review of Systems  There were no vitals taken for this visit.There is no height or weight on file to calculate BMI.  General Appearance: Well Groomed  Eye Contact:  Good  Speech:  Clear and Coherent and  Normal Rate  Volume:  Normal  Mood:  Anxious and Depressed  Affect:  Appropriate and Congruent  Thought Process:  Coherent, Goal Directed and Linear  Orientation:  Full (Time, Place, and Person)  Thought Content: Logical and Illogical   Suicidal Thoughts:  No  Homicidal Thoughts:  No  Memory:  Immediate;   Good Recent;   Good Remote;   Good  Judgement:  Good  Insight:  Good  Psychomotor Activity:  Normal  Concentration:  Concentration: Good and Attention Span: Good  Recall:  Good  Fund of Knowledge: Good  Language: Good  Akathisia:  No  Handed:  Right  AIMS (if indicated): Good  Assets:  Communication Skills Desire for Improvement Financial Resources/Insurance Housing Intimacy Social Support  ADL's:  Intact  Cognition: WNL  Sleep:  Good   Screenings: AIMS    Flowsheet Row [facility]  AIMS Total Score 0      GAD-7    Flowsheet Row Clinical Support from 11/22/2020 in [facility] Office Visit from 08/26/2020 in [facility] Video Visit from 06/01/2020 in [facility] Clinical Support from 03/02/2020 in [facility]  Total GAD-7 Score 10 15 5 9       PHQ2-9    Flowsheet Row Clinical Support from 11/22/2020 in [facility] Office Visit from 08/26/2020 in [facility] Counselor from 08/19/2020 in [facility] Video Visit from 06/01/2020 in [facility] Counselor from 10/14/2019 in [facility]  PHQ-2 Total Score 3 2 2 1 4   PHQ-9 Total Score 10 10 9 4 20       Flowsheet Row Office Visit from 08/26/2020 in [facility] Counselor from 08/19/2020 in [facility]  C-SSRS RISK CATEGORY No Risk No Risk         Assessment and Plan: Informed [facility] that he is doing well on his current medication regimen despite life stressors (work and child support).  No medication changes made today.  Patient agreeable to continue medications as prescribed.  1. Bipolar I disorder, most recent episode depressed (HCC)  Continue- divalproex (DEPAKOTE ER) 250 MG 24 hr tablet; Take 3 tablets (750 mg total) by mouth at bedtime. For mood stabilization  Dispense: 90 tablet; Refill: 2 Continue- hydrOXYzine (ATARAX/VISTARIL) 25 MG tablet; Take 1 tablet (25 mg total) by mouth every 6 (six) hours as needed for anxiety.  Dispense: 90 tablet; Refill: 2 Continue- sertraline (ZOLOFT) 50 MG tablet; Take 3 tablets (150 mg total) by mouth daily. For depression  Dispense: 90 tablet; Refill: 2 Continue- traZODone (DESYREL) 150 MG tablet; Take 1 tablet (150 mg total) by mouth at bedtime. For insomnia  Dispense: 30 tablet; Refill: 2  2. Posttraumatic stress disorder  Continue- prazosin (MINIPRESS) 1 MG capsule; Take 1 capsule (1 mg total) by mouth at bedtime.  Dispense: 30 capsule; Refill: 2  Follow-up in 3 months Follow-up with therapy   10/19/2020, NP 11/22/2020, 11:00 AM

## 2020-11-24 ENCOUNTER — Ambulatory Visit (INDEPENDENT_AMBULATORY_CARE_PROVIDER_SITE_OTHER): Payer: No Payment, Other | Admitting: Licensed Clinical Social Worker

## 2020-11-24 ENCOUNTER — Other Ambulatory Visit: Payer: Self-pay

## 2020-11-24 DIAGNOSIS — F3130 Bipolar disorder, current episode depressed, mild or moderate severity, unspecified: Secondary | ICD-10-CM

## 2020-11-24 DIAGNOSIS — F4310 Post-traumatic stress disorder, unspecified: Secondary | ICD-10-CM

## 2020-11-24 NOTE — Progress Notes (Signed)
   THERAPIST PROGRESS NOTE  Session Time: 12   Participation Level: Active  Behavioral Response: CasualAlertEuthymic  Type of Therapy: Individual Therapy  Treatment Goals addressed: Diagnosis: bipolar depression in partial remission  Interventions: CBT and Supportive  Summary: Dylan [name] is a 45 y.o. male who presents with euthymic mood/affect. He was alert and oriented x 5. [name] was dressed casually and engaged well in therapy. He was cooperative and maintained good eye contact.   Primary stressor for pt is work. 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 Pt states that her manager is young and has not got the hang of the job yet. [name] attempts to keep his head down and get done what he can within the context of his job.   LCSW and pt spoke about future goals for pt as he has been coming here for 1 year. He states he has improved with impulse control, mood swings, anger. He has hit objectives for exercising 3 x weekly, cooking, and spending quality time with his family at least 2 days per week. [name] reports he has a better relationship with his children. Future objective will be to decrease PHQ-9 below 5 and GAD-7 below 5.    Suicidal/Homicidal: NAwithout intent/plan  Therapist Response:    Intervention/Plan: LCSW used supportive and person-centered therapy in today's session. LCSW used encouragement , shaping, positive regard, and advice giving in today's session. Person centered therapy for nonjudgmental stance, being genuine, and empathy Plan: Return again in 4 weeks.      [name], LCSW 11/24/2020

## 2020-12-15 ENCOUNTER — Ambulatory Visit (INDEPENDENT_AMBULATORY_CARE_PROVIDER_SITE_OTHER): Payer: No Payment, Other | Admitting: Licensed Clinical Social Worker

## 2020-12-15 ENCOUNTER — Other Ambulatory Visit: Payer: Self-pay

## 2020-12-15 ENCOUNTER — Encounter (HOSPITAL_COMMUNITY): Payer: Self-pay | Admitting: Licensed Clinical Social Worker

## 2020-12-15 DIAGNOSIS — F3130 Bipolar disorder, current episode depressed, mild or moderate severity, unspecified: Secondary | ICD-10-CM

## 2020-12-15 DIAGNOSIS — F4310 Post-traumatic stress disorder, unspecified: Secondary | ICD-10-CM

## 2020-12-15 NOTE — Progress Notes (Signed)
   THERAPIST PROGRESS NOTE  Session Time: 32   Participation Level: Active  Behavioral Response: CasualAlertAnxious and Depressed  Type of Therapy: Individual Therapy  Treatment Goals addressed: Diagnosis: depression  Interventions: CBT and Supportive  Summary: Dylan [name] is a 45 y.o. male who presents with euthymic mood\affect.  Patient was alert and oriented x5 he presented as pleasant, cooperative, and maintained good eye contact.  Patient's primary stressor continues to be work and family conflict.  He states that he recently quit his job at [employer] as it was too stressful and they were overworking him.  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  Patient states that this has relieved a lot of his stress and tension for his anxiety and depression.   Other stressors include family conflict.  He still has problems with his son's mother who he has partial custody over.  The child's mother has taken Dylan [name] to court to get more money.  The court did increase payments to the mother by $60.  Patient still states that this is a win for him as the mother wanted more.  Dylan [name] reports that all his medications are working well and the recent change to his medications 3 weeks ago has able to him to sleep better at night.  Patient continues to utilize coping skills for working out 3-4 times weekly.   Suicidal/Homicidal: NAwithout intent/plan  Therapist Response:    Plan/Intervention: Supportive therapy was utilized in today's session.  LCSW used encouragement, praise, and unconditional positive regard.  Clarification and guidance were used in session today.  Plan for patient is to continue to workout 3-4 times weekly the objective of lowering GAD-7 below 10 and lowering PHQ-9 to below or equal to a 5.   Plan: Return again in 4 weeks.    [name], LCSW 12/15/2020

## 2021-01-19 ENCOUNTER — Ambulatory Visit (HOSPITAL_COMMUNITY): Payer: No Payment, Other | Admitting: Licensed Clinical Social Worker

## 2021-02-22 ENCOUNTER — Ambulatory Visit (INDEPENDENT_AMBULATORY_CARE_PROVIDER_SITE_OTHER): Payer: No Payment, Other | Admitting: Psychiatry

## 2021-02-22 ENCOUNTER — Other Ambulatory Visit: Payer: Self-pay

## 2021-02-22 ENCOUNTER — Encounter (HOSPITAL_COMMUNITY): Payer: Self-pay | Admitting: Psychiatry

## 2021-02-22 DIAGNOSIS — F4310 Post-traumatic stress disorder, unspecified: Secondary | ICD-10-CM

## 2021-02-22 DIAGNOSIS — F3130 Bipolar disorder, current episode depressed, mild or moderate severity, unspecified: Secondary | ICD-10-CM

## 2021-02-22 MED ORDER — HYDROXYZINE HCL 25 MG PO TABS: 25.0000 mg | ORAL_TABLET | Freq: Four times a day (QID) | ORAL | 3 refills | Status: DC | PRN

## 2021-02-22 MED ORDER — PRAZOSIN HCL 1 MG PO CAPS: 1.0000 mg | ORAL_CAPSULE | Freq: Every day | ORAL | 3 refills | Status: DC

## 2021-02-22 MED ORDER — SERTRALINE HCL 50 MG PO TABS: 150.0000 mg | ORAL_TABLET | Freq: Every day | ORAL | 3 refills | Status: DC

## 2021-02-22 MED ORDER — TRAZODONE HCL 150 MG PO TABS: 150.0000 mg | ORAL_TABLET | Freq: Every day | ORAL | 3 refills | Status: DC

## 2021-02-22 MED ORDER — DIVALPROEX SODIUM ER 250 MG PO TB24: 750.0000 mg | ORAL_TABLET | Freq: Every day | ORAL | 3 refills | Status: DC

## 2021-02-22 NOTE — Progress Notes (Signed)
BH MD/PA/NP OP Progress Note    02/22/2021 11:53 AM Dylan [name]  MRN:  [id number]  Chief Complaint: "Im good and tired"  HPI: 45 year old male seen today for follow up  psychiatric evaluation. He has a psychiatric history of bipolar 1, PTSD, and cannabis use.  He is currently being managed on hydroxyzine 25 mg every 6 hours as needed, and Zoloft 150 mg daily, trazodone 150 mg nightly, prazosin 1 mg nightly, and Depakote 750 mg nightly.  Patient notes that his medications are effective in managing his psychiatric conditions.   Today he is well-groomed, pleasant, cooperative, and engaged in conversation.  He informed Clinical research associate that since his last visit he has been doing good but notes that he is tired.  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  He notes that his job is demanding and tedious however reports that he finds enjoyment from it.  Patient informed Clinical research associate that his fiance is doing well.  He notes that it is her birthday today and he is preparing a meal for her.    Patient reports that his anxiety and his depression continues to be minimal.  Provider conducted a GAD-7 and patient scored a 3, at his last visit he scored 10.  Provider also conducted a PHQ-9 of P scored a 5, at his last visit he scored 10.  He endorsed adequate sleep and appetite. Today he denies SI/HI/VAH, mania, or paranoia.  No medication changes made today.  Patient agreeable to continue medications as prescribed.  He will follow-up with outpatient counselor for therapy.  No other concerns noted at this time.    Visit Diagnosis:    ICD-10-CM   1. Bipolar I disorder, most recent episode depressed (HCC)  F31.30 traZODone (DESYREL) 150 MG tablet    hydrOXYzine (ATARAX/VISTARIL) 25 MG tablet    divalproex (DEPAKOTE ER) 250 MG 24 hr tablet    sertraline (ZOLOFT) 50 MG tablet    2. Posttraumatic stress disorder  F43.10 prazosin (MINIPRESS) 1 MG  capsule      Past Psychiatric History: bipolar 1, PTSD, and cannabis use. Past Medical History:  Past Medical History:  Diagnosis Date   Bipolar 1 disorder (HCC)    PTSD (post-traumatic stress disorder)    History reviewed. No pertinent surgical history.  Family Psychiatric History: Son autism  Family History: History reviewed. No pertinent family history.  Social History:  Social History   Socioeconomic History   Marital status: Single    Spouse name: Not on file   Number of children: Not on file   Years of education: Not on file   Highest education level: Not on file  Occupational History   Not on file  Tobacco Use   Smoking status: Former    Types: Cigars   Smokeless tobacco: Never  Substance and Sexual Activity   Alcohol use: Not Currently    Comment: 3 drinks per month   Drug use: Not Currently   Sexual activity: Yes    Partners: Female    Comment: fiance  Other Topics Concern   Not on file  Social History Narrative   Not on file   Social Determinants of Health   Financial Resource Strain: Low Risk    Difficulty of Paying Living Expenses: Not very hard  Food Insecurity: No Food Insecurity   Worried About Running Out of Food in the Last Year: Never true   [employer]  in the Last Year: Never true  Transportation Needs: No Transportation Needs   Lack of Transportation (Medical): No   Lack of Transportation (Non-Medical): No  Physical Activity: Sufficiently Active   Days of Exercise per Week: 7 days   Minutes of Exercise per Session: 50 min  Stress: No Stress Concern Present   Feeling of Stress : Only a little  Social Connections: Moderately Isolated   Frequency of Communication with Friends and Family: Twice a week   Frequency of Social Gatherings with Friends and Family: Three times a week   Attends Religious Services: Never   Active Member of Clubs or Organizations: No   Attends Banker Meetings: Never   Marital Status: Living with  partner    Allergies:  Allergies  Allergen Reactions   Pork-Derived Products Other (See Comments)    Doesn't eat pork    Metabolic Disorder Labs: No results found for: HGBA1C, MPG No results found for: PROLACTIN No results found for: CHOL, TRIG, HDL, CHOLHDL, VLDL, LDLCALC Lab Results  Component Value Date   TSH 2.422 04/24/2015    Therapeutic Level Labs: No results found for: LITHIUM Lab Results  Component Value Date   VALPROATE 56 04/27/2015   No components found for:  CBMZ  Current Medications: Current Outpatient Medications  Medication Sig Dispense Refill   divalproex (DEPAKOTE ER) 250 MG 24 hr tablet Take 3 tablets (750 mg total) by mouth at bedtime. For mood stabilization 90 tablet 3   hydrOXYzine (ATARAX/VISTARIL) 25 MG tablet Take 1 tablet (25 mg total) by mouth every 6 (six) hours as needed for anxiety. 90 tablet 3   prazosin (MINIPRESS) 1 MG capsule Take 1 capsule (1 mg total) by mouth at bedtime. 30 capsule 3   sertraline (ZOLOFT) 50 MG tablet Take 3 tablets (150 mg total) by mouth daily. For depression 90 tablet 3   traZODone (DESYREL) 150 MG tablet Take 1 tablet (150 mg total) by mouth at bedtime. For insomnia 30 tablet 3   No current facility-administered medications for this visit.     Musculoskeletal: Strength & Muscle Tone: within normal limits Gait & Station: normal Patient leans: N/A  Psychiatric Specialty Exam: Review of Systems  There were no vitals taken for this visit.There is no height or weight on file to calculate BMI.  General Appearance: Well Groomed  Eye Contact:  Good  Speech:  Clear and Coherent and Normal Rate  Volume:  Normal  Mood:  Euthymic  Affect:  Appropriate and Congruent  Thought Process:  Coherent, Goal Directed and Linear  Orientation:  Full (Time, Place, and Person)  Thought Content: WDL and Logical   Suicidal Thoughts:  No  Homicidal Thoughts:  No  Memory:  Immediate;   Good Recent;   Good Remote;   Good   Judgement:  Good  Insight:  Good  Psychomotor Activity:  Normal  Concentration:  Concentration: Good and Attention Span: Good  Recall:  Good  Fund of Knowledge: Good  Language: Good  Akathisia:  No  Handed:  Right  AIMS (if indicated): Good  Assets:  Communication Skills Desire for Improvement Financial Resources/Insurance Housing Intimacy Social Support  ADL's:  Intact  Cognition: WNL  Sleep:  Good   Screenings: AIMS    Flowsheet Row [facility]  AIMS Total Score 0      GAD-7    Flowsheet Row Clinical Support from 02/22/2021 in [facility] Counselor from 12/15/2020  in [facility] Clinical Support from 11/22/2020 in [facility] Office Visit from 08/26/2020 in [facility] Video Visit from 06/01/2020 in [facility]  Total GAD-7 Score 3 10 10 15 5       PHQ2-9    Flowsheet Row Clinical Support from 02/22/2021 in [facility] Counselor from 12/15/2020 in [facility] Clinical Support from 11/22/2020 in [facility] Office Visit from 08/26/2020 in [name] Counselor from 08/19/2020 in [facility]  PHQ-2 Total Score 2 2 3 2 2   PHQ-9 Total Score 5 8 10 10 9       Flowsheet Row Office Visit from 08/26/2020 in [facility] Counselor from 08/19/2020 in [facility]  C-SSRS RISK CATEGORY No Risk No Risk        Assessment and Plan: Patient notes that he is doing well on his current medication.  No medication changes made today.  Patient agreeable to continue medications as prescribed.  1. Bipolar I disorder, most recent episode depressed (HCC)  Continue- divalproex (DEPAKOTE  ER) 250 MG 24 hr tablet; Take 3 tablets (750 mg total) by mouth at bedtime. For mood stabilization  Dispense: 90 tablet; Refill: 3 Continue- hydrOXYzine (ATARAX/VISTARIL) 25 MG tablet; Take 1 tablet (25 mg total) by mouth every 6 (six) hours as needed for anxiety.  Dispense: 90 tablet; Refill: 3 Continue- sertraline (ZOLOFT) 50 MG tablet; Take 3 tablets (150 mg total) by mouth daily. For depression  Dispense: 90 tablet; Refill: 3 Continue- traZODone (DESYREL) 150 MG tablet; Take 1 tablet (150 mg total) by mouth at bedtime. For insomnia  Dispense: 30 tablet; Refill: 3 2. Posttraumatic stress disorder  Continue- prazosin (MINIPRESS) 1 MG capsule; Take 1 capsule (1 mg total) by mouth at bedtime.  Dispense: 30 capsule; Refill: 3  Follow-up in 3 months Follow-up with therapy   [facility], NP 02/22/2021, 11:53 AM

## 2021-05-25 ENCOUNTER — Ambulatory Visit (INDEPENDENT_AMBULATORY_CARE_PROVIDER_SITE_OTHER): Payer: No Payment, Other | Admitting: Psychiatry

## 2021-05-25 ENCOUNTER — Other Ambulatory Visit: Payer: Self-pay

## 2021-05-25 ENCOUNTER — Encounter (HOSPITAL_COMMUNITY): Payer: Self-pay | Admitting: Psychiatry

## 2021-05-25 DIAGNOSIS — F3130 Bipolar disorder, current episode depressed, mild or moderate severity, unspecified: Secondary | ICD-10-CM | POA: Diagnosis not present

## 2021-05-25 MED ORDER — HYDROXYZINE HCL 25 MG PO TABS: 25.0000 mg | ORAL_TABLET | Freq: Four times a day (QID) | ORAL | 3 refills | Status: DC | PRN

## 2021-05-25 MED ORDER — SERTRALINE HCL 50 MG PO TABS: 150.0000 mg | ORAL_TABLET | Freq: Every day | ORAL | 3 refills | Status: DC

## 2021-05-25 MED ORDER — DIVALPROEX SODIUM ER 250 MG PO TB24: 750.0000 mg | ORAL_TABLET | Freq: Every day | ORAL | 3 refills | Status: DC

## 2021-05-25 MED ORDER — TRAZODONE HCL 150 MG PO TABS: 150.0000 mg | ORAL_TABLET | Freq: Every day | ORAL | 3 refills | Status: DC
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
## 2021-08-08 ENCOUNTER — Ambulatory Visit (INDEPENDENT_AMBULATORY_CARE_PROVIDER_SITE_OTHER): Payer: No Payment, Other | Admitting: Psychiatry

## 2021-08-08 DIAGNOSIS — F4310 Post-traumatic stress disorder, unspecified: Secondary | ICD-10-CM

## 2021-08-08 DIAGNOSIS — F3130 Bipolar disorder, current episode depressed, mild or moderate severity, unspecified: Secondary | ICD-10-CM | POA: Diagnosis not present

## 2021-08-08 MED ORDER — SERTRALINE HCL 50 MG PO TABS: 150.0000 mg | ORAL_TABLET | Freq: Every day | ORAL | 2 refills | Status: DC

## 2021-08-08 MED ORDER — DIVALPROEX SODIUM ER 250 MG PO TB24: 750.0000 mg | ORAL_TABLET | Freq: Every day | ORAL | 2 refills | Status: DC

## 2021-08-08 MED ORDER — TRAZODONE HCL 150 MG PO TABS: 150.0000 mg | ORAL_TABLET | Freq: Every day | ORAL | 2 refills | Status: DC

## 2021-08-08 MED ORDER — HYDROXYZINE HCL 25 MG PO TABS: 25.0000 mg | ORAL_TABLET | Freq: Four times a day (QID) | ORAL | 2 refills | Status: DC | PRN

## 2021-08-08 NOTE — Progress Notes (Signed)
BH MD/PA/NP OP Progress Note ? ?08/08/2021 4:11 PM ?[name]  ?MRN:  [id number] ? ?Virtual Visit via Telephone Note ? ?I connected with [name] on 08/08/21 at  3:30 PM EDT by telephone and verified that I am speaking with the correct person using two identifiers. ? ?Location: ?Patient: home ?Provider: offsite ?  ?I discussed the limitations, risks, security and privacy concerns of performing an evaluation and management service by telephone and the availability of in person appointments. I also discussed with the patient that there may be a patient responsible charge related to this service. The patient expressed understanding and agreed to proceed. ? ?  ?I discussed the assessment and treatment plan with the patient. The patient was provided an opportunity to ask questions and all were answered. The patient agreed with the plan and demonstrated an understanding of the instructions. ?  ?The patient was advised to call back or seek an in-person evaluation if the symptoms worsen or if the condition fails to improve as anticipated. ? ?I provided 5 minutes of non-face-to-face time during this encounter. ? ? ?Mcneil Sober, NP  ? ?Chief Complaint: Medication management ? ?HPI: Hitoshi Werts is a 46 year old male presenting to [facility] behavioral health outpatient for follow-up psychiatric evaluation.  He has a psychiatric history of posttraumatic stress disorder, bipolar disorder and cannabis use disorder.  Patient symptoms are managed with Depakote ER 750 mg daily at bedtime, hydroxyzine 25 mg every 6 hours as needed for anxiety, sertraline 150 mg daily, and trazodone 150 mg daily at bedtime.  Patient reports that his medications manage his symptoms well and that he is medication compliant.  Patient denies adverse effects and denies the need for dose adjustment today.  No medication changes today. ? ?Patient is alert and oriented x4, calm, pleasant and willing to engage.  He reports good mood, sleep cycle and  appetite.  Patient denies suicidal or homicidal ideations, paranoia, delusional thought, auditory or visual hallucinations. ? ? ?Visit Diagnosis:  ?  ICD-10-CM   ?1. Bipolar I disorder, most recent episode depressed (HCC)  F31.30   ?  ?2. Posttraumatic stress disorder  F43.10   ?  ? ? ?Past Psychiatric History: Bipolar disorder and posttraumatic stress disorder ? ?Past Medical History:  ?Past Medical History:  ?Diagnosis Date  ? Bipolar 1 disorder (HCC)   ? PTSD (post-traumatic stress disorder)   ? No past surgical history on file. ? ?Family Psychiatric History: None known ? ?Family History: No family history on file. ? ?Social History:  ?Social History  ? ?Socioeconomic History  ? Marital status: Single  ?  Spouse name: Not on file  ? Number of children: Not on file  ? Years of education: Not on file  ? Highest education level: Not on file  ?Occupational History  ? Not on file  ?Tobacco Use  ? Smoking status: Former  ?  Types: Cigars  ? Smokeless tobacco: Never  ?Substance and Sexual Activity  ? Alcohol use: Not Currently  ?  Comment: 3 drinks per month  ? Drug use: Not Currently  ? Sexual activity: Yes  ?  Partners: Female  ?  Comment: fiance  ?Other Topics Concern  ? Not on file  ?Social History Narrative  ? Not on file  ? ?Social Determinants of Health  ? ?Financial Resource Strain: Low Risk   ? Difficulty of Paying Living Expenses: Not very hard  ?Food Insecurity: No Food Insecurity  ? Worried About Programme researcher, broadcasting/film/video  in the Last Year: Never true  ? Ran Out of Food in the Last Year: Never true  ?Transportation Needs: No Transportation Needs  ? Lack of Transportation (Medical): No  ? Lack of Transportation (Non-Medical): No  ?Physical Activity: Sufficiently Active  ? Days of Exercise per Week: 7 days  ? Minutes of Exercise per Session: 50 min  ?Stress: No Stress Concern Present  ? Feeling of Stress : Only a little  ?Social Connections: Moderately Isolated  ? Frequency of Communication with Friends and Family:  Twice a week  ? Frequency of Social Gatherings with Friends and Family: Three times a week  ? Attends Religious Services: Never  ? Active Member of Clubs or Organizations: No  ? Attends Banker Meetings: Never  ? Marital Status: Living with partner  ? ? ?Allergies:  ?Allergies  ?Allergen Reactions  ? Pork-Derived Products Other (See Comments)  ?  Doesn't eat pork  ? ? ?Metabolic Disorder Labs: ?No results found for: HGBA1C, MPG ?No results found for: PROLACTIN ?No results found for: CHOL, TRIG, HDL, CHOLHDL, VLDL, LDLCALC ?Lab Results  ?Component Value Date  ? TSH 2.422 04/24/2015  ? ? ?Therapeutic Level Labs: ?No results found for: LITHIUM ?Lab Results  ?Component Value Date  ? VALPROATE 56 04/27/2015  ? ?No components found for:  CBMZ ? ?Current Medications: ?Current Outpatient Medications  ?Medication Sig Dispense Refill  ? divalproex (DEPAKOTE ER) 250 MG 24 hr tablet Take 3 tablets (750 mg total) by mouth at bedtime. For mood stabilization 90 tablet 3  ? hydrOXYzine (ATARAX) 25 MG tablet Take 1 tablet (25 mg total) by mouth every 6 (six) hours as needed for anxiety. 90 tablet 3  ? sertraline (ZOLOFT) 50 MG tablet Take 3 tablets (150 mg total) by mouth daily. For depression 90 tablet 3  ? traZODone (DESYREL) 150 MG tablet Take 1 tablet (150 mg total) by mouth at bedtime. For insomnia 30 tablet 3  ? ?No current facility-administered medications for this visit.  ? ? ? ?Musculoskeletal: ?Strength & Muscle Tone: N/A virtual visit  ?Gait & Station: N/A virtual visit ?Patient leans: N/A ? ?Psychiatric Specialty Exam: ?Review of Systems  ?Psychiatric/Behavioral:  Negative for hallucinations, self-injury and suicidal ideas.   ?All other systems reviewed and are negative.  ?There were no vitals taken for this visit.There is no height or weight on file to calculate BMI.  ?General Appearance: N/A  ?Eye Contact: N/A  ?Speech: Clear coherent  ?Volume: Normal  ?Mood: Euthymic  ?Affect: N/A  ?Thought Process: Goal  directed  ?Orientation:  Full (Time, Place, and Person)  ?Thought Content: Logical   ?Suicidal Thoughts:  No  ?Homicidal Thoughts:  No  ?Memory: Good  ?Judgement: Good  ?Insight: Good  ?Psychomotor Activity: N/A  ?Concentration: Good  ?Recall: Good  ?Fund of Knowledge: Good  ?Language: Good  ?Akathisia: N/A  ?Handed: Right  ?AIMS (if indicated): not done  ?Assets:  Communication Skills ?Desire for Improvement  ?ADL's:  Intact  ?Cognition: WNL  ?Sleep:  Good  ? ?Screenings: ?AIMS   ? ?Flowsheet Row [facility]  ?AIMS Total Score 0  ? ?  ? ?GAD-7   ? ?Flowsheet Row Clinical Support from 05/25/2021 in [facility] Clinical Support from 02/22/2021 in [facility] Counselor from 12/15/2020 in [facility] Clinical Support from 11/22/2020 in [facility] Office Visit from 08/26/2020 in [place]  [facility]  ?Total GAD-7 Score 5 3 10 10 15   ? ?  ? ?PHQ2-9   ? ?Flowsheet [facility]  ?PHQ-2 Total Score 2 2 2 3 2   ?PHQ-9 Total Score 7 5 8 10 10   ? ?  ? ?Flowsheet Row [facility]  ?C-SSRS RISK CATEGORY No Risk No Risk  ? ?  ? ? ? ?Assessment and Plan: Erin SonsShon Archambeau is a 46 year old [facility]up psychiatric evaluation.  He has a psychiatric history of posttraumatic stress disorder, bipolar  disorder and cannabis use disorder.  Patient symptoms are managed with Depakote ER 750 mg daily at bedtime, hydroxyzine 25 mg every 6 hours as needed for anxiety, sertraline 150 mg daily, and trazodone 15

## 2021-10-31 ENCOUNTER — Ambulatory Visit (INDEPENDENT_AMBULATORY_CARE_PROVIDER_SITE_OTHER): Payer: No Payment, Other | Admitting: Psychiatry

## 2021-10-31 ENCOUNTER — Encounter (HOSPITAL_COMMUNITY): Payer: Self-pay | Admitting: Psychiatry

## 2021-10-31 DIAGNOSIS — F3130 Bipolar disorder, current episode depressed, mild or moderate severity, unspecified: Secondary | ICD-10-CM | POA: Diagnosis not present

## 2021-10-31 MED ORDER — TRAZODONE HCL 150 MG PO TABS: 150.0000 mg | ORAL_TABLET | Freq: Every day | ORAL | 3 refills | Status: DC

## 2021-10-31 MED ORDER — DIVALPROEX SODIUM ER 250 MG PO TB24: 750.0000 mg | ORAL_TABLET | Freq: Every day | ORAL | 3 refills | Status: DC

## 2021-10-31 MED ORDER — SERTRALINE HCL 50 MG PO TABS: 150.0000 mg | ORAL_TABLET | Freq: Every day | ORAL | 3 refills | Status: DC

## 2021-10-31 MED ORDER — HYDROXYZINE HCL 25 MG PO TABS: 25.0000 mg | ORAL_TABLET | Freq: Four times a day (QID) | ORAL | 3 refills | Status: DC | PRN

## 2021-10-31 NOTE — Progress Notes (Signed)
[place] MD/PA/NP OP Progress Note  Virtual Visit via Telephone Note  I connected with Dylan [name] on 10/31/21 at  1:30 PM EDT by telephone and verified that I am speaking with the correct person using two identifiers.  Location: Patient: home Provider: Clinic   I discussed the limitations, risks, security and privacy concerns of performing an evaluation and management service by telephone and the availability of in person appointments. I also discussed with the patient that there may be a patient responsible charge related to this service. The patient expressed understanding and agreed to proceed.   I provided 30 minutes of non-face-to-face time during this encounter.   10/31/2021 1:33 PM Dylan [name]  MRN:  [id number]  Chief Complaint: " I am doing pretty good"    HPI: 46 year old male seen today for follow up  psychiatric evaluation. He has a psychiatric history of bipolar 1, PTSD, and cannabis use.  He is currently being managed on hydroxyzine 25 mg every 6 hours as needed, Zoloft 150 mg daily, and trazodone 150 mg nightly, he reports that his medications are effective in managing his psychiatric conditions.    Today he was unable to login virtually so his assessment was done over the phobe. During exam he was pleasant, cooperative, and engaged in conversation.  He informed Probation officer that he has been doing well. He notes his mood is stable and reports that he has minimal anxiety and depression. Today provider also conducted PHQ-9 and patient scored a 4. Provider also conducted a GAD 7 and patient scored a  He endorses adequate sleep  and appetite.  Today he denies SI/HI/VAH, mania, or paranoia.     No medication changes made today.  Patient agreeable to continue medications as prescribed.  He will follow-up with outpatient counselor for therapy.  No other concerns noted at this time.    Visit Diagnosis:    ICD-10-CM   1. Bipolar I disorder, most recent episode depressed (HCC)  F31.30  divalproex (DEPAKOTE ER) 250 MG 24 hr tablet    hydrOXYzine (ATARAX) 25 MG tablet    sertraline (ZOLOFT) 50 MG tablet    traZODone (DESYREL) 150 MG tablet      Past Psychiatric History: bipolar 1, PTSD, and cannabis use. Past Medical History:  Past Medical History:  Diagnosis Date   Bipolar 1 disorder ([place])    PTSD (post-traumatic stress disorder)    No past surgical history on file.  Family Psychiatric History: Son autism  Family History: No family history on file.  Social History:  Social History   Socioeconomic History   Marital status: Single    Spouse name: Not on file   Number of children: Not on file   Years of education: Not on file   Highest education level: Not on file  Occupational History   Not on file  Tobacco Use   Smoking status: Former    Types: Cigars   Smokeless tobacco: Never  Substance and Sexual Activity   Alcohol use: Not Currently    Comment: 3 drinks per month   Drug use: Not Currently   Sexual activity: Yes    Partners: Female    Comment: fiance  Other Topics Concern   Not on file  Social History Narrative   Not on file   Social Determinants of Health   Financial Resource Strain: Low Risk  (08/19/2020)   Overall Financial Resource Strain (CARDIA)    Difficulty of Paying Living Expenses: Not very hard  Food Insecurity: No  Food Insecurity (08/19/2020)   Hunger Vital Sign    Worried About Running Out of Food in the Last Year: Never true    Ran Out of Food in the Last Year: Never true  Transportation Needs: No Transportation Needs (08/19/2020)   PRAPARE - Administrator, Civil Service (Medical): No    Lack of Transportation (Non-Medical): No  Physical Activity: Sufficiently Active (08/19/2020)   Exercise Vital Sign    Days of Exercise per Week: 7 days    Minutes of Exercise per Session: 50 min  Stress: No Stress Concern Present (09/22/2020)   [employer] of Occupational Health - Occupational Stress Questionnaire    Feeling  of Stress : Only a little  Recent Concern: Stress - Stress Concern Present (08/19/2020)   [employer] of Occupational Health - Occupational Stress Questionnaire    Feeling of Stress : Rather much  Social Connections: Moderately Isolated (12/15/2020)   Social Connection and Isolation Panel [NHANES]    Frequency of Communication with Friends and Family: Twice a week    Frequency of Social Gatherings with Friends and Family: Three times a week    Attends Religious Services: Never    Active Member of Clubs or Organizations: No    Attends Banker Meetings: Never    Marital Status: Living with partner    Allergies:  Allergies  Allergen Reactions   Pork-Derived Products Other (See Comments)    Doesn't eat pork    Metabolic Disorder Labs: No results found for: "HGBA1C", "MPG" No results found for: "PROLACTIN" No results found for: "CHOL", "TRIG", "HDL", "CHOLHDL", "VLDL", "LDLCALC" Lab Results  Component Value Date   TSH 2.422 04/24/2015    Therapeutic Level Labs: No results found for: "LITHIUM" Lab Results  Component Value Date   VALPROATE 56 04/27/2015   No results found for: "CBMZ"  Current Medications: Current Outpatient Medications  Medication Sig Dispense Refill   divalproex (DEPAKOTE ER) 250 MG 24 hr tablet Take 3 tablets (750 mg total) by mouth at bedtime. For mood stabilization 90 tablet 3   hydrOXYzine (ATARAX) 25 MG tablet Take 1 tablet (25 mg total) by mouth every 6 (six) hours as needed for anxiety. 90 tablet 3   sertraline (ZOLOFT) 50 MG tablet Take 3 tablets (150 mg total) by mouth daily. For depression 90 tablet 3   traZODone (DESYREL) 150 MG tablet Take 1 tablet (150 mg total) by mouth at bedtime. For insomnia 30 tablet 3   No current facility-administered medications for this visit.     Musculoskeletal: Strength & Muscle Tone:  Unable to assess due to telephone visit Gait & Station:  Unable to assess due to telephone visit Patient leans:  N/A  Psychiatric Specialty Exam: Review of Systems  There were no vitals taken for this visit.There is no height or weight on file to calculate BMI.  General Appearance: Unable to assess due to telephone visit  Eye Contact:   Unable to assess due to telephone visit  Speech:  Clear and Coherent and Normal Rate  Volume:  Normal  Mood:  Euthymic  Affect:  Appropriate and Congruent  Thought Process:  Coherent, Goal Directed and Linear  Orientation:  Full (Time, Place, and Person)  Thought Content: WDL and Logical   Suicidal Thoughts:  No  Homicidal Thoughts:  No  Memory:  Immediate;   Good Recent;   Good Remote;   Good  Judgement:  Good  Insight:  Good  Psychomotor Activity:   Unable to  assess due to telephone visit  Concentration:  Concentration: Good and Attention Span: Good  Recall:  Good  Fund of Knowledge: Good  Language: Good  Akathisia:   Unable to assess due to telephone visit  Handed:  Right  AIMS (if indicated): Good  Assets:  Communication Skills Desire for Improvement Financial Resources/Insurance Housing Intimacy Social Support  ADL's:  Intact  Cognition: WNL  Sleep:  Good   Screenings: AIMS    Flowsheet Row [facility]  AIMS Total Score 0      GAD-7    Flowsheet Row Office Visit from 10/31/2021 in [facility] Clinical Support from 05/25/2021 in [facility] Clinical Support from 02/22/2021 in [facility] Counselor from 12/15/2020 in [facility] Clinical Support from 11/22/2020 in [facility]  Total GAD-7 Score 4 5 3 10 10       PHQ2-9    Flowsheet Row Office Visit from 10/31/2021 in [facility] Clinical Support from 05/25/2021 in [facility] Clinical Support from 02/22/2021 in [facility] Counselor from 12/15/2020 in [facility] Clinical Support from 11/22/2020 in [facility]  PHQ-2 Total Score 1 2 2 2 3   PHQ-9 Total Score 4 7 5 8 10       Flowsheet Row Office Visit from 08/26/2020 in [facility] Counselor from 08/19/2020 in [facility]  C-SSRS RISK CATEGORY No Risk No Risk        Assessment and Plan: Patient notes that he is doing well on his current medication.  No medication changes made today.  Patient agreeable to continue medications as prescribed.  1. Bipolar I disorder, most recent episode depressed (HCC)  Continue- divalproex (DEPAKOTE ER) 250 MG 24 hr tablet; Take 3 tablets (750 mg total) by mouth at bedtime. For mood stabilization  Dispense: 90 tablet; Refill: 3 Continue- hydrOXYzine (ATARAX/VISTARIL) 25 MG tablet; Take 1 tablet (25 mg total) by mouth every 6 (six) hours as needed for anxiety.  Dispense: 90 tablet; Refill: 3 Continue- sertraline (ZOLOFT) 50 MG tablet; Take 3 tablets (150 mg total) by mouth daily. For depression  Dispense: 90 tablet; Refill: 3 Continue- traZODone (DESYREL) 150 MG tablet; Take 1 tablet (150 mg total) by mouth at bedtime. For insomnia  Dispense: 30 tablet; Refill: 3   Follow-up in 3 months Follow-up with therapy   [facility], NP 10/31/2021, 1:33 PM

## 2022-01-31 ENCOUNTER — Ambulatory Visit (INDEPENDENT_AMBULATORY_CARE_PROVIDER_SITE_OTHER): Payer: No Payment, Other | Admitting: Psychiatry

## 2022-01-31 ENCOUNTER — Encounter (HOSPITAL_COMMUNITY): Payer: Self-pay | Admitting: Psychiatry

## 2022-01-31 DIAGNOSIS — F3130 Bipolar disorder, current episode depressed, mild or moderate severity, unspecified: Secondary | ICD-10-CM

## 2022-01-31 MED ORDER — TRAZODONE HCL 150 MG PO TABS: 150.0000 mg | ORAL_TABLET | Freq: Every day | ORAL | 3 refills | Status: DC

## 2022-01-31 MED ORDER — DIVALPROEX SODIUM ER 250 MG PO TB24: 750.0000 mg | ORAL_TABLET | Freq: Every day | ORAL | 3 refills | Status: DC

## 2022-01-31 MED ORDER — SERTRALINE HCL 50 MG PO TABS: 150.0000 mg | ORAL_TABLET | Freq: Every day | ORAL | 3 refills | Status: DC

## 2022-01-31 MED ORDER — HYDROXYZINE HCL 25 MG PO TABS: 25.0000 mg | ORAL_TABLET | Freq: Four times a day (QID) | ORAL | 3 refills | Status: DC | PRN

## 2022-01-31 NOTE — Progress Notes (Signed)
[place] MD/PA/NP OP Progress Note  Virtual Visit via Telephone Note  I connected with Dylan [name] on 01/31/22 at  3:30 PM EDT by telephone and verified that I am speaking with the correct person using two identifiers.  Location: Patient: home Provider: Clinic   I discussed the limitations, risks, security and privacy concerns of performing an evaluation and management service by telephone and the availability of in person appointments. I also discussed with the patient that there may be a patient responsible charge related to this service. The patient expressed understanding and agreed to proceed.   I provided 30 minutes of non-face-to-face time during this encounter.   01/31/2022 1:13 PM Dylan [name]  MRN:  [id number]  Chief Complaint: " I am nervous about my wedding"    HPI: 46 year old male seen today for follow up  psychiatric evaluation. He has a psychiatric history of bipolar 1, PTSD, and cannabis use.  He is currently being managed on hydroxyzine 25 mg every 6 hours as needed, Zoloft 150 mg daily, Depakote 750 mg daily, and trazodone 150 mg nightly. he reports that his medications are effective in managing his psychiatric conditions.    Today he was unable to login virtually so his assessment was done over the phobe. During exam he was pleasant, cooperative, and engaged in conversation.  He informed Probation officer that he is nervous about his upcoming wedding. He notes that he has been having issues sleeping due to anticipation of his wedding day. He notes that he sleeps approximately 5 hours nightly but notes that he is able to cope with this. Overall patient notes his mood is stable and reports he has minimal anxiety and depression. Today provider also conducted PHQ-9 and patient scored a 7. Today provider also conducted a GAD 7 and patient scored a 6. He endorses adequate sleep  and appetite.  Today he denies SI/HI/VAH, mania, or paranoia.     No medication changes made today.  Patient  agreeable to continue medications as prescribed.  He will follow-up with outpatient counselor for therapy.  No other concerns noted at this time.    Visit Diagnosis:    ICD-10-CM   1. Bipolar I disorder, most recent episode depressed (HCC)  F31.30 divalproex (DEPAKOTE ER) 250 MG 24 hr tablet    hydrOXYzine (ATARAX) 25 MG tablet    sertraline (ZOLOFT) 50 MG tablet    traZODone (DESYREL) 150 MG tablet      Past Psychiatric History: bipolar 1, PTSD, and cannabis use. Past Medical History:  Past Medical History:  Diagnosis Date   Bipolar 1 disorder ([place])    PTSD (post-traumatic stress disorder)    History reviewed. No pertinent surgical history.  Family Psychiatric History: Son autism  Family History: History reviewed. No pertinent family history.  Social History:  Social History   Socioeconomic History   Marital status: Single    Spouse name: Not on file   Number of children: Not on file   Years of education: Not on file   Highest education level: Not on file  Occupational History   Not on file  Tobacco Use   Smoking status: Former    Types: Cigars   Smokeless tobacco: Never  Substance and Sexual Activity   Alcohol use: Not Currently    Comment: 3 drinks per month   Drug use: Not Currently   Sexual activity: Yes    Partners: Female    Comment: fiance  Other Topics Concern   Not on file  Social  History Narrative   Not on file   Social Determinants of Health   Financial Resource Strain: Low Risk  (08/19/2020)   Overall Financial Resource Strain (CARDIA)    Difficulty of Paying Living Expenses: Not very hard  Food Insecurity: No Food Insecurity (08/19/2020)   Hunger Vital Sign    Worried About Running Out of Food in the Last Year: Never true    Ran Out of Food in the Last Year: Never true  Transportation Needs: No Transportation Needs (08/19/2020)   PRAPARE - Hydrologist (Medical): No    Lack of Transportation (Non-Medical): No   Physical Activity: Sufficiently Active (08/19/2020)   Exercise Vital Sign    Days of Exercise per Week: 7 days    Minutes of Exercise per Session: 50 min  Stress: No Stress Concern Present (09/22/2020)   [place]    Feeling of Stress : Only a little  Recent Concern: Stress - Stress Concern Present (08/19/2020)   [place]    Feeling of Stress : Rather much  Social Connections: Moderately Isolated (12/15/2020)   Social Connection and Isolation Panel [NHANES]    Frequency of Communication with Friends and Family: Twice a week    Frequency of Social Gatherings with Friends and Family: Three times a week    Attends Religious Services: Never    Active Member of Clubs or Organizations: No    Attends Archivist Meetings: Never    Marital Status: Living with partner    Allergies:  Allergies  Allergen Reactions   Pork-Derived Products Other (See Comments)    Doesn't eat pork    Metabolic Disorder Labs: No results found for: "HGBA1C", "MPG" No results found for: "PROLACTIN" No results found for: "CHOL", "TRIG", "HDL", "CHOLHDL", "VLDL", "LDLCALC" Lab Results  Component Value Date   TSH 2.422 04/24/2015    Therapeutic Level Labs: No results found for: "LITHIUM" Lab Results  Component Value Date   VALPROATE 56 04/27/2015   No results found for: "CBMZ"  Current Medications: Current Outpatient Medications  Medication Sig Dispense Refill   divalproex (DEPAKOTE ER) 250 MG 24 hr tablet Take 3 tablets (750 mg total) by mouth at bedtime. For mood stabilization 90 tablet 3   hydrOXYzine (ATARAX) 25 MG tablet Take 1 tablet (25 mg total) by mouth every 6 (six) hours as needed for anxiety. 90 tablet 3   sertraline (ZOLOFT) 50 MG tablet Take 3 tablets (150 mg total) by mouth daily. For depression 90 tablet 3   traZODone (DESYREL) 150 MG tablet Take 1  tablet (150 mg total) by mouth at bedtime. For insomnia 30 tablet 3   No current facility-administered medications for this visit.     Musculoskeletal: Strength & Muscle Tone:  Unable to assess due to telephone visit Gait & Station:  Unable to assess due to telephone visit Patient leans: N/A  Psychiatric Specialty Exam: Review of Systems  There were no vitals taken for this visit.There is no height or weight on file to calculate BMI.  General Appearance: Unable to assess due to telephone visit  Eye Contact:   Unable to assess due to telephone visit  Speech:  Clear and Coherent and Normal Rate  Volume:  Normal  Mood:  Euthymic  Affect:  Appropriate and Congruent  Thought Process:  Coherent, Goal Directed and Linear  Orientation:  Full (Time, Place, and Person)  Thought Content:  WDL and Logical   Suicidal Thoughts:  No  Homicidal Thoughts:  No  Memory:  Immediate;   Good Recent;   Good Remote;   Good  Judgement:  Good  Insight:  Good  Psychomotor Activity:   Unable to assess due to telephone visit  Concentration:  Concentration: Good and Attention Span: Good  Recall:  Good  Fund of Knowledge: Good  Language: Good  Akathisia:   Unable to assess due to telephone visit  Handed:  Right  AIMS (if indicated): Good  Assets:  Communication Skills Desire for Improvement Financial Resources/Insurance Housing Intimacy Social Support  ADL's:  Intact  Cognition: WNL  Sleep:  Good   Screenings: AIMS    Flowsheet Row Admission (Discharged) from 04/21/2015 in [facility]  AIMS Total Score 0      GAD-7    [place] Office Visit from 01/31/2022 in [facility] Office Visit from 10/31/2021 in [place] from 05/25/2021 in [place] from 02/22/2021 in [facility] Counselor from 12/15/2020 in [facility]  Total GAD-7 Score 6 4 5 3 10       PHQ2-9    [place] Office Visit from 01/31/2022 in [facility] Office Visit from 10/31/2021 in [place] from 05/25/2021 in [place] from 02/22/2021 in [facility] Counselor from 12/15/2020 in [place]  PHQ-2 Total Score 2 1 2 2 2   PHQ-9 Total Score 7 4 7 5 8       [place] Office Visit from 08/26/2020 in [facility] Counselor from 08/19/2020 in [place] No Risk No Risk        Assessment and Plan: Patient notes that he is anxious about his upcoming wedding.  He notes that his sleep has been poor due to anticipating his wedding date.  He however notes that he is able to cope with that.  No medication changes made today.  Patient agreeable to continue medications prescribed.  1. Bipolar I disorder, most recent episode depressed (HCC)  Continue- divalproex (DEPAKOTE ER) 250 MG 24 hr tablet; Take 3 tablets (750 mg total) by mouth at bedtime. For mood stabilization  Dispense: 90 tablet; Refill: 3 Continue- hydrOXYzine (ATARAX/VISTARIL) 25 MG tablet; Take 1 tablet (25 mg total) by mouth every 6 (six) hours as needed for anxiety.  Dispense: 90 tablet; Refill: 3 Continue- sertraline (ZOLOFT) 50 MG tablet; Take 3 tablets (150 mg total) by mouth daily. For depression  Dispense: 90 tablet; Refill: 3 Continue- traZODone (DESYREL) 150 MG tablet; Take 1 tablet (150 mg total) by mouth at bedtime. For insomnia  Dispense: 30 tablet; Refill: 3   Follow-up in 3 months Follow-up with therapy   Salley Slaughter, NP 01/31/2022, 1:13 PM

## 2022-05-05 ENCOUNTER — Telehealth (HOSPITAL_COMMUNITY): Payer: No Payment, Other | Admitting: Psychiatry

## 2022-06-07 ENCOUNTER — Ambulatory Visit (HOSPITAL_COMMUNITY)
Admission: EM | Admit: 2022-06-07 | Discharge: 2022-06-08 | Disposition: A | Discharge status: Other Acute Inpatient Hospital | Payer: Commercial Managed Care - HMO | Attending: Family | Admitting: Family

## 2022-06-07 DIAGNOSIS — F3130 Bipolar disorder, current episode depressed, mild or moderate severity, unspecified: Secondary | ICD-10-CM

## 2022-06-07 DIAGNOSIS — Z1152 Encounter for screening for COVID-19: Secondary | ICD-10-CM | POA: Diagnosis not present

## 2022-06-07 DIAGNOSIS — F17210 Nicotine dependence, cigarettes, uncomplicated: Secondary | ICD-10-CM | POA: Diagnosis not present

## 2022-06-07 DIAGNOSIS — R45851 Suicidal ideations: Secondary | ICD-10-CM | POA: Diagnosis not present

## 2022-06-07 DIAGNOSIS — F314 Bipolar disorder, current episode depressed, severe, without psychotic features: Secondary | ICD-10-CM | POA: Insufficient documentation

## 2022-06-07 DIAGNOSIS — Z79899 Other long term (current) drug therapy: Secondary | ICD-10-CM | POA: Diagnosis not present

## 2022-06-07 DIAGNOSIS — F1290 Cannabis use, unspecified, uncomplicated: Secondary | ICD-10-CM | POA: Insufficient documentation

## 2022-06-07 LAB — COMPREHENSIVE METABOLIC PANEL
ALT: 25 U/L (ref 0–44)
AST: 20 U/L (ref 15–41)
Albumin: 4.5 g/dL (ref 3.5–5.0)
Alkaline Phosphatase: 53 U/L (ref 38–126)
Anion gap: 9 (ref 5–15)
BUN: 20 mg/dL (ref 6–20)
CO2: 25 mmol/L (ref 22–32)
Calcium: 9.9 mg/dL (ref 8.9–10.3)
Chloride: 103 mmol/L (ref 98–111)
Creatinine, Ser: 0.99 mg/dL (ref 0.61–1.24)
GFR, Estimated: >60 mL/min (ref >60)
Glucose, Bld: 89 mg/dL (ref 70–99)
Potassium: 4.3 mmol/L (ref 3.5–5.1)
Sodium: 137 mmol/L (ref 135–145)
Total Bilirubin: 0.9 mg/dL (ref 0.3–1.2)
Total Protein: 7.2 g/dL (ref 6.5–8.1)

## 2022-06-07 LAB — CBC WITH DIFFERENTIAL/PLATELET
Abs Immature Granulocytes: 0.03 10*3/uL (ref 0.00–0.07)
Basophils Absolute: 0 10*3/uL (ref 0.0–0.1)
Basophils Relative: 0 %
Eosinophils Absolute: 0 10*3/uL (ref 0.0–0.5)
Eosinophils Relative: 0 %
HCT: 43.4 % (ref 39.0–52.0)
Hemoglobin: 14.9 g/dL (ref 13.0–17.0)
Immature Granulocytes: 0 %
Lymphocytes Relative: 18 %
Lymphs Abs: 1.9 10*3/uL (ref 0.7–4.0)
MCH: 33.1 pg (ref 26.0–34.0)
MCHC: 34.3 g/dL (ref 30.0–36.0)
MCV: 96.4 fL (ref 80.0–100.0)
Monocytes Absolute: 0.7 10*3/uL (ref 0.1–1.0)
Monocytes Relative: 7 %
Neutro Abs: 7.7 10*3/uL (ref 1.7–7.7)
Neutrophils Relative %: 75 %
Platelets: 316 10*3/uL (ref 150–400)
RBC: 4.5 MIL/uL (ref 4.22–5.81)
RDW: 13 % (ref 11.5–15.5)
WBC: 10.4 10*3/uL (ref 4.0–10.5)
nRBC: 0 % (ref 0.0–0.2)

## 2022-06-07 LAB — MAGNESIUM: Magnesium: 2.2 mg/dL (ref 1.7–2.4)

## 2022-06-07 LAB — LIPID PANEL
Cholesterol: 154 mg/dL (ref 0–200)
HDL: 36 mg/dL — ABNORMAL LOW (ref >40)
LDL Cholesterol: 100 mg/dL — ABNORMAL HIGH (ref 0–99)
Total CHOL/HDL Ratio: 4.3 RATIO
Triglycerides: 88 mg/dL (ref <150)
VLDL: 18 mg/dL (ref 0–40)

## 2022-06-07 LAB — POCT URINE DRUG SCREEN - MANUAL ENTRY (I-SCREEN)
POC Amphetamine UR: NOT DETECTED
POC Buprenorphine (BUP): NOT DETECTED
POC Cocaine UR: NOT DETECTED
POC Marijuana UR: POSITIVE — AB
POC Methadone UR: NOT DETECTED
POC Methamphetamine UR: NOT DETECTED
POC Morphine: NOT DETECTED
POC Oxazepam (BZO): NOT DETECTED
POC Oxycodone UR: NOT DETECTED
POC Secobarbital (BAR): NOT DETECTED

## 2022-06-07 LAB — POC SARS CORONAVIRUS 2 AG: SARSCOV2ONAVIRUS 2 AG: NEGATIVE

## 2022-06-07 LAB — RESP PANEL BY RT-PCR (RSV, FLU A&B, COVID)  RVPGX2
Influenza A by PCR: NEGATIVE
Influenza B by PCR: NEGATIVE
Resp Syncytial Virus by PCR: NEGATIVE
SARS Coronavirus 2 by RT PCR: NEGATIVE

## 2022-06-07 LAB — HEMOGLOBIN A1C
Hgb A1c MFr Bld: 5.1 % (ref 4.8–5.6)
Mean Plasma Glucose: 99.67 mg/dL

## 2022-06-07 LAB — TSH: TSH: 0.774 u[IU]/mL (ref 0.350–4.500)

## 2022-06-07 LAB — ETHANOL: Alcohol, Ethyl (B): <10 mg/dL (ref <10)

## 2022-06-07 LAB — VALPROIC ACID LEVEL: Valproic Acid Lvl: <10 ug/mL — ABNORMAL LOW (ref 50.0–100.0)

## 2022-06-07 MED ORDER — TRAZODONE HCL 150 MG PO TABS
150.0000 mg | ORAL_TABLET | Freq: Every day | ORAL | Status: DC
  Administered 2022-06-07: 150 mg via ORAL
  Filled 2022-06-07: qty 1

## 2022-06-07 MED ORDER — DIVALPROEX SODIUM ER 500 MG PO TB24
750.0000 mg | ORAL_TABLET | Freq: Every day | ORAL | Status: DC
  Administered 2022-06-07: 750 mg via ORAL
  Filled 2022-06-07: qty 1

## 2022-06-07 MED ORDER — HYDROXYZINE HCL 25 MG PO TABS: 25.0000 mg | ORAL_TABLET | Freq: Four times a day (QID) | ORAL | Status: DC | PRN

## 2022-06-07 MED ORDER — ALUM & MAG HYDROXIDE-SIMETH 200-200-20 MG/5ML PO SUSP: 30.0000 mL | ORAL | Status: DC | PRN

## 2022-06-07 MED ORDER — ACETAMINOPHEN 325 MG PO TABS: 650.0000 mg | ORAL_TABLET | Freq: Four times a day (QID) | ORAL | Status: DC | PRN

## 2022-06-07 MED ORDER — MAGNESIUM HYDROXIDE 400 MG/5ML PO SUSP: 30.0000 mL | Freq: Every day | ORAL | Status: DC | PRN

## 2022-06-07 MED ORDER — SERTRALINE HCL 50 MG PO TABS
50.0000 mg | ORAL_TABLET | Freq: Every day | ORAL | Status: DC
  Administered 2022-06-07: 50 mg via ORAL
  Filled 2022-06-07: qty 1

## 2022-06-07 MED ORDER — NICOTINE 14 MG/24HR TD PT24
14.0000 mg | MEDICATED_PATCH | Freq: Every day | TRANSDERMAL | Status: DC
  Administered 2022-06-07: 14 mg via TRANSDERMAL
  Filled 2022-06-07: qty 1
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
## 2022-06-07 NOTE — ED Notes (Signed)
Pt sleeping soundly. Breathing even and unlabored.  In view of nursing station. Staff will continue to monitor for safety.

## 2022-06-07 NOTE — ED Notes (Signed)
Vol consent signed and faxed to [facility]

## 2022-06-07 NOTE — ED Notes (Signed)
Pt laying quietly in bed watching TV.  No distress noted. Staff will continue to monitor for safety.  

## 2022-06-07 NOTE — ED Notes (Signed)
Patient resting quietly in bed and watching TV. Respirations even and unlabored. No acute distress noted. Awaiting for disposition.

## 2022-06-07 NOTE — ED Provider Notes (Signed)
[facility] Continuous Assessment Admission H&P  Date: 06/07/22 Patient Name: Dylan [name] MRN: [id number] Chief Complaint: Bipolar disorder, most recent episode depressed  Diagnoses:  Final diagnoses:  Bipolar I disorder, most recent episode depressed ([place])  Suicidal ideation    HPI: Patient presents voluntarily to [facility] behavioral health for walk-in assessment.   Patient is assessed, face-to-face, by nurse practitioner. He is seated in assessment area, no acute distress. Consulted with provider, Dr.  Dwyane Dee, and chart reviewed on 06/07/2022. He  is alert and oriented, pleasant and cooperative during assessment.   [name] states "I have been out of my medications for 6 weeks and I have been depressed for quite some time, I have been dealing with this since 2014."  Patient endorses suicidal ideation.  He denies plan at this time.  He endorses history of 1 previous episode of suicidal ideation approximately 8 years ago when he had thoughts of shooting himself but did not follow through with this plan.  He is unable to contract verbally for safety with this Probation officer.  Patient endorses depressed mood, suicidal ideations, decreased sleep and decreased appetite, worsening for several months.  Recent stressors include financial difficulties.  His financial difficulties have caused him to be unable to afford his medications.  Patient  presents with depressed mood, congruent affect.  He denies homicidal ideations.  He denies history of nonsuicidal self-harm behavior.      Patient has normal speech and behavior.  He  denies auditory and visual hallucinations.  Patient is able to converse coherently with goal-directed thoughts and no distractibility or preoccupation.  Denies symptoms of paranoia.  Objectively there is no evidence of psychosis/mania or delusional thinking. Patient resides in [place], he denies access to weapons.  He is seeking employment.    Patient endorses rare alcohol use.   Typically consumes less than 1 drink per week.  Most recent alcohol use more than 1 week ago.  He also endorses frequent use of marijuana, reports using marijuana most days of the week.  Most recent marijuana use on yesterday.  He uses tobacco and nicotine daily.  Patient offered support and encouragement.  Reviewed treatment plan to include voluntary admission to inpatient psychiatric facility.  Reviewed restarting home medications including Depakote 750 mg nightly, hydroxyzine 25 mg every 6 as needed anxiety and trazodone 150 mg nightly.  Will restart sertraline at 50 mg starting dose.  Reviewed CODE STATUS, patient will remain full CODE STATUS.  He verbalizes agreement and understanding with plan.   Total Time spent with patient: 30 minutes  Musculoskeletal  Strength & Muscle Tone: within normal limits Gait & Station: normal Patient leans: N/A  Psychiatric Specialty Exam  Presentation General Appearance:  Appropriate for Environment; Casual  Eye Contact: Good  Speech: Clear and Coherent; Normal Rate  Speech Volume: Normal  Handedness: Right   Mood and Affect  Mood: Depressed  Affect: Depressed   Thought Process  Thought Processes: Coherent; Goal Directed; Linear  Descriptions of Associations:Intact  Orientation:Full (Time, Place and Person)  Thought Content:Logical; WDL  Diagnosis of Schizophrenia or Schizoaffective disorder in past: No   Hallucinations:Hallucinations: None  Ideas of Reference:None  Suicidal Thoughts:Suicidal Thoughts: Yes, Active SI Active Intent and/or Plan: Without Plan  Homicidal Thoughts:Homicidal Thoughts: No   Sensorium  Memory: Immediate Good; Recent Good  Judgment: Fair  Insight: Fair   Executive Functions  Concentration: Good  Attention Span: Good  Recall: Good  Fund of Knowledge: Good  Language: Good   Psychomotor Activity  Psychomotor Activity: Psychomotor Activity: Normal   Assets   Assets: Communication Skills; Desire for Improvement; Housing; Intimacy; Leisure Time; Physical Health; Resilience; Social Support   Sleep  Sleep: Sleep: Fair   Nutritional Assessment (For OBS and FBC admissions only) Has the patient had a weight loss or gain of 10 pounds or more in the last 3 months?: No Has the patient had a decrease in food intake/or appetite?: No Does the patient have dental problems?: No Does the patient have eating habits or behaviors that may be indicators of an eating disorder including binging or inducing vomiting?: No Has the patient recently lost weight without trying?: 0 Has the patient been eating poorly because of a decreased appetite?: 0 Malnutrition Screening Tool Score: 0    Physical Exam Vitals and nursing note reviewed.  Constitutional:      Appearance: Normal appearance. He is well-developed and normal weight.  HENT:     Head: Normocephalic and atraumatic.     Nose: Nose normal.  Cardiovascular:     Rate and Rhythm: Normal rate.  Pulmonary:     Effort: Pulmonary effort is normal.  Musculoskeletal:        General: Normal range of motion.     Cervical back: Normal range of motion.  Skin:    General: Skin is warm and dry.  Neurological:     Mental Status: He is alert and oriented to person, place, and time.  Psychiatric:        Attention and Perception: Attention and perception normal.        Mood and Affect: Affect normal. Mood is depressed.        Speech: Speech normal.        Behavior: Behavior normal. Behavior is cooperative.        Thought Content: Thought content includes suicidal ideation.        Cognition and Memory: Cognition and memory normal.    Review of Systems  Constitutional: Negative.   HENT: Negative.    Eyes: Negative.   Respiratory: Negative.    Cardiovascular: Negative.   Gastrointestinal: Negative.   Genitourinary: Negative.   Musculoskeletal: Negative.   Skin: Negative.   Neurological: Negative.    Psychiatric/Behavioral:  Positive for depression and suicidal ideas.     Blood pressure 123/61, pulse 83, temperature 98.4 F (36.9 C), resp. rate 19, SpO2 100 %. There is no height or weight on file to calculate BMI.  Past Psychiatric History: PTSD, bipolar 1 disorder, most recent episode depressed, cannabis use disorder, PTSD  Is the patient at risk to self? Yes  Has the patient been a risk to self in the past 6 months? No .    Has the patient been a risk to self within the distant past? Yes   Is the patient a risk to others? No   Has the patient been a risk to others in the past 6 months? No   Has the patient been a risk to others within the distant past? No   Past Medical History: None reported  Family History: None reported  Social History: Cannabis use disorder, nicotine use  Last Labs:  No visits with results within 6 Month(s) from this visit.  Latest known visit with results is:  Orders Only on 12/09/2018  Component Date Value Ref Range Status   SARS-CoV-2, NAA 12/09/2018 Not Detected  Not Detected Final   Comment: This test was developed and its performance characteristics determined by [employer]. This test has not been FDA  cleared or approved. This test has been authorized by FDA under an Emergency Use Authorization (EUA). This test is only authorized for the duration of time the declaration that circumstances exist justifying the authorization of the emergency use of in vitro diagnostic tests for detection of SARS-CoV-2 virus and/or diagnosis of COVID-19 infection under section 564(b)(1) of the Act, 21 U.S.C. 706CBJ-6(E)(8), unless the authorization is terminated or revoked sooner. When diagnostic testing is negative, the possibility of a false negative result should be considered in the context of a patient's recent exposures and the presence of clinical signs and symptoms consistent with COVID-19. An individual without symptoms of COVID-19 and who is  not shedding SARS-CoV-2 virus would expect to have a negative (not detected) result in this assay.     Allergies: Pork-derived products  Medications:  Facility Ordered Medications  Medication   acetaminophen (TYLENOL) tablet 650 mg   alum & mag hydroxide-simeth (MAALOX/MYLANTA) 200-200-20 MG/5ML suspension 30 mL   magnesium hydroxide (MILK OF MAGNESIA) suspension 30 mL   nicotine (NICODERM CQ - dosed in mg/24 hours) patch 14 mg   divalproex (DEPAKOTE ER) 24 hr tablet 750 mg   hydrOXYzine (ATARAX) tablet 25 mg   sertraline (ZOLOFT) tablet 50 mg   traZODone (DESYREL) tablet 150 mg   PTA Medications  Medication Sig   divalproex (DEPAKOTE ER) 250 MG 24 hr tablet Take 3 tablets (750 mg total) by mouth at bedtime. For mood stabilization   hydrOXYzine (ATARAX) 25 MG tablet Take 1 tablet (25 mg total) by mouth every 6 (six) hours as needed for anxiety.   sertraline (ZOLOFT) 50 MG tablet Take 3 tablets (150 mg total) by mouth daily. For depression   traZODone (DESYREL) 150 MG tablet Take 1 tablet (150 mg total) by mouth at bedtime. For insomnia    Medical Decision Making  Patient remains voluntary.  He will be admitted to observation unit at [facility] behavioral health while awaiting inpatient psychiatric admission.  Laboratory studies ordered including CBC, CMP, ethanol, A1c, lipid panel, magnesium, prolactin and TSH.  Urine drug screen order initiated.  Valproic acid level.  EKG ordered.  Current medications: -Acetaminophen 650 mg every 6 as needed/mild pain -Maalox 30 mL oral every 4 as needed/digestion -Magnesium hydroxide 30 mL daily as needed/mild constipation -Nicotine NicoDerm transdermal patch 14 mg daily/nicotine withdrawal  Restarted home medications including: -Depakote ER 750 mg nightly -Hydroxyzine 25 mg every 6 hours as needed/anxiety -Sertraline 50 mg daily/mood -Trazodone 50 mg nightly as needed/sleep     Recommendations  Based on my evaluation the patient  does not appear to have an emergency medical condition.  Lenard Lance, FNP 06/07/22  2:06 PM

## 2022-06-07 NOTE — Progress Notes (Signed)
   06/07/22 1122  [place] (Walk-ins at [facility] only)  How Did You Hear About [place]? Other (Comment) (Patient of [facility])  What Is the Reason for Your Visit/Call Today? Pt reports he lost his job in June 2023 and unable to get his medications. Pt reports he has been without medications for about a month and dx with bipolar disorder, PTSD and anxiety. Reports racing thoughts, fits of anger, difficulty sleeping, was taking trazodone. Reports SI no plan, HI no plan, intent or identifiable person. Receives outpatient services at [facility]. Inpt hospitalizations twice last one was in 2020. No prior suicide attempts. Pt reports he is tried of feeling this way and is ready for help.  How Long Has This Been Causing You Problems? 1 wk - 1 month  Have You Recently Had Any Thoughts About Hurting Yourself? Yes  How long ago did you have thoughts about hurting yourself? about two and half weeks  Are You Planning to [place] At This time? No  Have you Recently Had Thoughts About [place]? No  Are You Planning To Harm Someone At This Time? Yes  Explanation: no plan or intent to harm anyone. Reports daily thoughts. No identifiable person.  Are you currently experiencing any auditory, visual or other hallucinations? No (nightmares and bad dreams)  Have You Used Any Alcohol or Drugs in the Past 24 Hours? No (Blunt last night)  Do you have any current medical co-morbidities that require immediate attention? No  Clinician description of patient physical appearance/behavior: flat affect, depressed  What Do You Feel Would Help You the Most Today? Treatment for Depression or other mood problem  If access to [facility] was not available, would you have sought care in the Emergency Department? No  Determination of Need Routine (7 days)  Options For Referral Inpatient Hospitalization;Medication Management;Outpatient Therapy;Facility-Based Crisis

## 2022-06-07 NOTE — Progress Notes (Signed)
Pt was accepted to [facility] 06/07/2022, pending signed voluntary consent faxed to [phone]. Bed assignment: 614-4  Pt meets inpatient criteria per Beatriz Stallion, FNP  Attending Physician will be Janine Limbo, MD  Report can be called to: Adult unit: [phone]  Pt can arrive after 2000  Care Team Notified: [facility] Lynnda Shields, RN, Anda Latina, RN, Drema Halon, RN, and Beatriz Stallion, FNP  [place], [place]  06/07/2022 3:34 PM

## 2022-06-07 NOTE — ED Notes (Signed)
Report given to RN Johnette Abraham, at [facility]. Patient sleeping comfortably currently. Awaiting for safe transport for disposition. Will continue to monitor for safety.

## 2022-06-07 NOTE — ED Notes (Signed)
Attempted report but RN stated to wait until 11pm to call report.

## 2022-06-08 ENCOUNTER — Other Ambulatory Visit: Payer: Self-pay

## 2022-06-08 ENCOUNTER — Encounter (HOSPITAL_COMMUNITY): Payer: Self-pay | Admitting: Family

## 2022-06-08 ENCOUNTER — Inpatient Hospital Stay (HOSPITAL_COMMUNITY)
Admission: AD | Admit: 2022-06-08 | Discharge: 2022-06-12 | DRG: 885 | Disposition: A | Discharge status: Home / Self Care | Payer: Commercial Managed Care - HMO | Source: Intra-hospital | Attending: Psychiatry | Admitting: Psychiatry

## 2022-06-08 DIAGNOSIS — F319 Bipolar disorder, unspecified: Secondary | ICD-10-CM | POA: Diagnosis present

## 2022-06-08 DIAGNOSIS — F1220 Cannabis dependence, uncomplicated: Secondary | ICD-10-CM | POA: Diagnosis present

## 2022-06-08 DIAGNOSIS — Z818 Family history of other mental and behavioral disorders: Secondary | ICD-10-CM | POA: Diagnosis not present

## 2022-06-08 DIAGNOSIS — Z560 Unemployment, unspecified: Secondary | ICD-10-CM | POA: Diagnosis not present

## 2022-06-08 DIAGNOSIS — G4700 Insomnia, unspecified: Secondary | ICD-10-CM | POA: Diagnosis present

## 2022-06-08 DIAGNOSIS — Z79899 Other long term (current) drug therapy: Secondary | ICD-10-CM

## 2022-06-08 DIAGNOSIS — Z91014 Allergy to mammalian meats: Secondary | ICD-10-CM | POA: Diagnosis not present

## 2022-06-08 DIAGNOSIS — Z87891 Personal history of nicotine dependence: Secondary | ICD-10-CM | POA: Diagnosis not present

## 2022-06-08 DIAGNOSIS — Z1152 Encounter for screening for COVID-19: Secondary | ICD-10-CM

## 2022-06-08 DIAGNOSIS — F4310 Post-traumatic stress disorder, unspecified: Secondary | ICD-10-CM | POA: Diagnosis present

## 2022-06-08 DIAGNOSIS — F3130 Bipolar disorder, current episode depressed, mild or moderate severity, unspecified: Principal | ICD-10-CM | POA: Diagnosis present

## 2022-06-08 DIAGNOSIS — R45851 Suicidal ideations: Secondary | ICD-10-CM | POA: Diagnosis present

## 2022-06-08 DIAGNOSIS — F411 Generalized anxiety disorder: Secondary | ICD-10-CM | POA: Diagnosis not present

## 2022-06-08 HISTORY — DX: Other specified health status: Z78.9

## 2022-06-08 MED ORDER — DIVALPROEX SODIUM ER 500 MG PO TB24
750.0000 mg | ORAL_TABLET | Freq: Every day | ORAL | Status: DC
  Administered 2022-06-08 – 2022-06-11 (×4): 750 mg via ORAL
  Filled 2022-06-08 (×6): qty 1

## 2022-06-08 MED ORDER — LORAZEPAM 1 MG PO TABS: 1.0000 mg | ORAL_TABLET | ORAL | Status: DC | PRN

## 2022-06-08 MED ORDER — ZIPRASIDONE MESYLATE 20 MG IM SOLR: 20.0000 mg | INTRAMUSCULAR | Status: DC | PRN

## 2022-06-08 MED ORDER — RISPERIDONE 2 MG PO TBDP: 2.0000 mg | ORAL_TABLET | Freq: Three times a day (TID) | ORAL | Status: DC | PRN

## 2022-06-08 MED ORDER — HYDROXYZINE HCL 25 MG PO TABS: 25.0000 mg | ORAL_TABLET | Freq: Four times a day (QID) | ORAL | Status: DC | PRN

## 2022-06-08 MED ORDER — MAGNESIUM HYDROXIDE 400 MG/5ML PO SUSP: 30.0000 mL | Freq: Every day | ORAL | Status: DC | PRN

## 2022-06-08 MED ORDER — ACETAMINOPHEN 325 MG PO TABS
650.0000 mg | ORAL_TABLET | Freq: Four times a day (QID) | ORAL | Status: DC | PRN
  Administered 2022-06-08: 650 mg via ORAL
  Filled 2022-06-08: qty 2

## 2022-06-08 MED ORDER — TRAZODONE HCL 150 MG PO TABS
150.0000 mg | ORAL_TABLET | Freq: Every day | ORAL | Status: DC
  Filled 2022-06-08 (×3): qty 1

## 2022-06-08 MED ORDER — HYDROXYZINE HCL 50 MG PO TABS
50.0000 mg | ORAL_TABLET | Freq: Every day | ORAL | Status: DC
  Administered 2022-06-08 – 2022-06-11 (×4): 50 mg via ORAL
  Filled 2022-06-08 (×5): qty 1

## 2022-06-08 MED ORDER — SERTRALINE HCL 50 MG PO TABS
50.0000 mg | ORAL_TABLET | Freq: Every day | ORAL | Status: DC
  Administered 2022-06-08 – 2022-06-12 (×5): 50 mg via ORAL
  Filled 2022-06-08 (×6): qty 1

## 2022-06-08 MED ORDER — ALUM & MAG HYDROXIDE-SIMETH 200-200-20 MG/5ML PO SUSP: 30.0000 mL | ORAL | Status: DC | PRN

## 2022-06-08 NOTE — H&P (Signed)
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  Other: NA    Physician Treatment Plan for Primary Diagnosis: Bipolar I disorder, most recent episode depressed ([place])  Long Term Goal(s): Improvement in symptoms so as ready for discharge  Short Term Goals: Ability to identify changes in lifestyle to reduce recurrence of condition will improve, Ability to verbalize feelings will improve, Ability to disclose and discuss suicidal ideas, and Ability to demonstrate self-control will improve  Physician Treatment Plan for Secondary Diagnosis: Principal Problem:   Bipolar I disorder, most recent episode depressed ([place]) Active Problems:   Posttraumatic stress disorder   Cannabis use disorder, moderate, dependence ([place])   Bipolar 1 disorder, depressed ([place])  Long Term  Goal(s): Improvement in symptoms so as ready for discharge  Short Term Goals: Ability to identify and develop effective coping behaviors will improve, Ability to maintain clinical measurements within normal limits will improve, Compliance with prescribed medications will improve, and Ability to identify triggers associated with substance abuse/mental health issues will improve  I certify that inpatient services furnished can reasonably be expected to improve the patient's condition.    Lindell Spar, NP, pmhnp, fnp-bc. 1/25/202412:19 PM

## 2022-06-08 NOTE — Progress Notes (Signed)
This is 2nd [facility] inpt admission, voluntarily admitted from [facility]. Pt admitted due to SI thoughts no plan. Pt reports he has been out of his medications x6wks, due to financial issues and has not had a job since June 2023. Pt reports that he was homeless for several years til 2021. Currently lives with his fiance, whom he says is supportive. Pt reports having a 46yo autistic son, that lives with his ex-girlfriend, and pt is currently having "child support issues with." Pt states "he is trying to prevent acting on impulses and angry/depressed at the system." Pt reports that he normally stays up at night playing "online gambling and has trouble sleeping. Pt smokes cigars daily, marijuana, and drinks rarely.  Pt reports that he does not want to take trazodone, due to making him congested. Currently denies SI/HI or hallucinations (a) 15 min checks (r) safety maintained.

## 2022-06-08 NOTE — Group Note (Signed)
[facility] LCSW Group Therapy Note   Group Date: 06/08/2022 Start Time: 1100 End Time: 1200   Type of Therapy and Topic: Group Therapy: Avoiding Self-Sabotaging and Enabling Behaviors  Participation Level: Active  Mood:  Description of Group:  In this group, patients will learn how to identify obstacles, self-sabotaging and enabling behaviors, as well as: what are they, why do we do them and what needs these behaviors meet. Discuss unhealthy relationships and how to have positive healthy boundaries with those that sabotage and enable. Explore aspects of self-sabotage and enabling in yourself and how to limit these self-destructive behaviors in everyday life.   Therapeutic Goals: 1. Patient will identify one obstacle that relates to self-sabotage and enabling behaviors 2. Patient will identify one personal self-sabotaging or enabling behavior they did prior to admission 3. Patient will state a plan to change the above identified behavior 4. Patient will demonstrate ability to communicate their needs through discussion and/or role play.    Summary of Patient Progress:  Minimal engagement      Therapeutic Modalities:  Cognitive Behavioral Therapy Person-Centered Therapy Motivational Interviewing    Lizandra Zakrzewski S Sanvika Cuttino, LCSW

## 2022-06-08 NOTE — Tx Team (Signed)
Initial Treatment Plan 06/08/2022 1:34 AM [name] Patient [id number]    PATIENT STRESSORS: Financial difficulties   Legal issue     PATIENT STRENGTHS: Ability for insight  Average or above average intelligence  General fund of knowledge  Physical Health  Special hobby/interest    PATIENT IDENTIFIED PROBLEMS: Alteration in mood depressed  anxiety                   DISCHARGE CRITERIA:  Ability to meet basic life and health needs Improved stabilization in mood, thinking, and/or behavior Need for constant or close observation no longer present Reduction of life-threatening or endangering symptoms to within safe limits  PRELIMINARY DISCHARGE PLAN: Outpatient therapy Return to previous living arrangement Return to previous work or school arrangements  PATIENT/FAMILY INVOLVEMENT: This treatment plan has been presented to and reviewed with the patient, Dylan [name], and/or family member, The patient and family have been given the opportunity to ask questions and make suggestions.  Raul Del, RN 06/08/2022, 1:34 AM
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
## 2022-06-08 NOTE — BHH Suicide Risk Assessment (Signed)
[place] INPATIENT:  Family/Significant Other Suicide Prevention Education  Suicide Prevention Education:  Education Completed; 06-08-2022 -Dylan [name] [facility]) [phone] has been identified by the pa-2024 Patient as the significant other with whom the patient will be residing, and identified as the person([name]) who will aid the patient in the event of a mental health crisis (suicidal ideations/suicide attempt).  With written consent from the patient, the significant other has been provided the following suicide prevention education, prior to the and/or following the discharge of the patient.  The suicide prevention education provided includes the following: Suicide risk factors Suicide prevention and interventions National Suicide Hotline telephone number [facility] assessment telephone number [name] Emergency Assistance [place] and/or Residential Mobile Crisis Unit telephone number  Request made of family/significant other to: Remove weapons (e.g., guns, rifles, knives), all items previously/currently identified as safety concern.   Remove drugs/medications (over-the-counter, prescriptions, illicit drugs), all items previously/currently identified as a safety concern.  Dylan [name] (Fiance) [phone] verbalizes understanding of the suicide prevention education information provided.  The family member/significant other agrees to remove the items of safety concern listed above.  Dylan [name] Dylan [name] 06/08/2022, 2:23 PM

## 2022-06-08 NOTE — BHH Suicide Risk Assessment (Signed)
Suicide Risk Assessment  Admission Assessment    [facility] Admission Suicide Risk Assessment   Nursing information obtained from:  Patient  Demographic factors:  Male  Current Mental Status:  Suicidal ideation indicated by patient, Suicidal ideation indicated by others, Self-harm behaviors  Loss Factors:  Financial problems / change in socioeconomic status  Historical Factors:  Prior suicide attempts, Impulsivity  Risk Reduction Factors:  Living with another person, especially a relative  Total Time spent with patient: 1 hour  Principal Problem: <principal problem not specified>  Diagnosis:  Active Problems:   Bipolar 1 disorder, depressed ([place])  Subjective Data: See H&P.  Continued Clinical Symptoms:  Alcohol Use Disorder Identification Test Final Score (AUDIT): 1 The "Alcohol Use Disorders Identification Test", Guidelines for Use in Primary Care, Second Edition.  World Pharmacologist [facility]). Score between 0-7:  no or low risk or alcohol related problems. Score between 8-15:  moderate risk of alcohol related problems. Score between 16-19:  high risk of alcohol related problems. Score 20 or above:  warrants further diagnostic evaluation for alcohol dependence and treatment.  CLINICAL FACTORS:   Bipolar Disorder:   Depressive phase Alcohol/Substance Abuse/Dependencies More than one psychiatric diagnosis Unstable or Poor Therapeutic Relationship Previous Psychiatric Diagnoses and Treatments  Musculoskeletal: Strength & Muscle Tone: within normal limits Gait & Station: normal Patient leans: N/A  Psychiatric Specialty Exam:  Presentation  General Appearance:  Appropriate for Environment; Casual  Eye Contact: Good  Speech: Clear and Coherent; Normal Rate  Speech Volume: Normal  Handedness: Right  Mood and Affect  Mood: Depressed  Affect: Depressed  Thought Process  Thought Processes: Coherent; Goal Directed; Linear  Descriptions of  Associations:Intact  Orientation:Full (Time, Place and Person)  Thought Content:Logical; WDL  History of Schizophrenia/Schizoaffective disorder:No  Duration of Psychotic Symptoms:No data recorded Hallucinations:Hallucinations: None  Ideas of Reference:None  Suicidal Thoughts:Suicidal Thoughts: Yes, Active SI Active Intent and/or Plan: Without Plan  Homicidal Thoughts:Homicidal Thoughts: No  Sensorium  Memory: Immediate Good; Recent Good  Judgment: Fair  Insight: Fair  Executive Functions  Concentration: Good  Attention Span: Good  Recall: Good  Fund of Knowledge: Good  Language: Good  Psychomotor Activity  Psychomotor Activity: Psychomotor Activity: Normal  Assets  Assets: Communication Skills; Desire for Improvement; Housing; Intimacy; Leisure Time; Physical Health; Resilience; Social Support  Sleep  Sleep: Sleep: Fair  Physical Exam: Blood pressure 113/78, pulse 80, temperature 98.4 F (36.9 C), temperature source Oral, resp. rate 20, height 6' (1.829 m), weight 84.8 kg, SpO2 100 %. Body mass index is 25.36 kg/m.  COGNITIVE FEATURES THAT CONTRIBUTE TO RISK:  Closed-mindedness, Polarized thinking, and Thought constriction (tunnel vision)    SUICIDE RISK:   Severe:  Frequent, intense, and enduring suicidal ideation, specific plan, no subjective intent, but some objective markers of intent (i.e., choice of lethal method), the method is accessible, some limited preparatory behavior, evidence of impaired self-control, severe dysphoria/symptomatology, multiple risk factors present, and few if any protective factors, particularly a lack of social support.  PLAN OF CARE: See H&P  I certify that inpatient services furnished can reasonably be expected to improve the patient's condition.   Lindell Spar, NP, pmhnp, fnp-bc. 06/08/2022, 9:05 AM

## 2022-06-08 NOTE — Plan of Care (Signed)
  Problem: Coping: Goal: Coping ability will improve Outcome: Progressing   Problem: Health Behavior/Discharge Planning: Goal: Ability to make decisions will improve Outcome: Progressing   Problem: Safety: Goal: Ability to disclose and discuss suicidal ideas will improve Outcome: Progressing   Problem: Self-Concept: Goal: Level of anxiety will decrease Outcome: Progressing   Problem: Self-Concept: Goal: Ability to modify response to factors that promote anxiety will improve Outcome: Progressing

## 2022-06-08 NOTE — BHH Group Notes (Signed)
Pt attended wrap up group 

## 2022-06-08 NOTE — Progress Notes (Signed)
   06/08/22 0900  Psych Admission Type (Psych Patients Only)  Admission Status Voluntary  Psychosocial Assessment  Patient Complaints Anger;Depression  Eye Contact Fair  Facial Expression Flat  Affect Blunted  Speech Logical/coherent;Soft  Interaction Guarded  Motor Activity Fidgety  Appearance/Hygiene Disheveled;In scrubs  Behavior Characteristics Cooperative;Fidgety;Guarded  Mood Depressed;Anxious;Pleasant  Thought Process  Coherency WDL  Content Blaming others  Delusions WDL  Perception WDL  Hallucination None reported or observed  Judgment Limited  Confusion None  Danger to Self  Current suicidal ideation? Denies  Agreement Not to Harm Self Yes  Description of Agreement verbal contract  Danger to Others  Danger to Others None reported or observed

## 2022-06-09 ENCOUNTER — Encounter (HOSPITAL_COMMUNITY): Payer: Self-pay

## 2022-06-09 DIAGNOSIS — F3130 Bipolar disorder, current episode depressed, mild or moderate severity, unspecified: Secondary | ICD-10-CM | POA: Diagnosis not present

## 2022-06-09 LAB — PROLACTIN: Prolactin: 9 ng/mL (ref 3.9–22.7)

## 2022-06-09 NOTE — Progress Notes (Signed)
   06/09/22 2226  Psych Admission Type (Psych Patients Only)  Admission Status Voluntary  Psychosocial Assessment  Patient Complaints Anxiety  Eye Contact Brief  Facial Expression Flat  Affect Appropriate to circumstance  Speech Logical/coherent  Interaction Guarded  Motor Activity Fidgety  Appearance/Hygiene Unremarkable  Behavior Characteristics Appropriate to situation  Mood Depressed  Thought Process  Coherency WDL  Content WDL  Delusions None reported or observed  Perception WDL  Hallucination None reported or observed  Judgment Limited  Confusion None  Danger to Self  Current suicidal ideation? Denies  Agreement Not to Harm Self Yes  Description of Agreement verbal  Danger to Others  Danger to Others None reported or observed

## 2022-06-09 NOTE — BH IP Treatment Plan (Signed)
Interdisciplinary Treatment and Diagnostic Plan Update  06/09/2022 Time of Session: 9:55 AM  Dylan [name] MRN: [id number]  Principal Diagnosis: Bipolar I disorder, most recent episode depressed (HCC)  Secondary Diagnoses: Principal Problem:   Bipolar I disorder, most recent episode depressed (HCC) Active Problems:   Posttraumatic stress disorder   Cannabis use disorder, moderate, dependence (HCC)   Bipolar 1 disorder, depressed (HCC)   Current Medications:  Current Facility-Administered Medications  Medication Dose Route Frequency Provider Last Rate Last Admin   acetaminophen (TYLENOL) tablet 650 mg  650 mg Oral Q6H PRN Lenard Lance, FNP   650 mg at 06/08/22 2138   alum & mag hydroxide-simeth (MAALOX/MYLANTA) 200-200-20 MG/5ML suspension 30 mL  30 mL Oral Q4H PRN Lenard Lance, FNP       divalproex (DEPAKOTE ER) 24 hr tablet 750 mg  750 mg Oral QHS Lenard Lance, FNP   750 mg at 06/08/22 2136   hydrOXYzine (ATARAX) tablet 25 mg  25 mg Oral Q6H PRN Lenard Lance, FNP       hydrOXYzine (ATARAX) tablet 50 mg  50 mg Oral QHS Massengill, Harrold Donath, MD   50 mg at 06/08/22 2137   risperiDONE (RISPERDAL M-TABS) disintegrating tablet 2 mg  2 mg Oral Q8H PRN Massengill, Harrold Donath, MD       And   LORazepam (ATIVAN) tablet 1 mg  1 mg Oral PRN Massengill, Harrold Donath, MD       And   ziprasidone (GEODON) injection 20 mg  20 mg Intramuscular PRN Massengill, Harrold Donath, MD       magnesium hydroxide (MILK OF MAGNESIA) suspension 30 mL  30 mL Oral Daily PRN Lenard Lance, FNP       sertraline (ZOLOFT) tablet 50 mg  50 mg Oral Daily Lenard Lance, FNP   50 mg at 06/09/22 5427   PTA Medications: Medications Prior to Admission  Medication Sig Dispense Refill Last Dose   divalproex (DEPAKOTE ER) 250 MG 24 hr tablet Take 3 tablets (750 mg total) by mouth at bedtime. For mood stabilization 90 tablet 3    hydrOXYzine (ATARAX) 25 MG tablet Take 1 tablet (25 mg total) by mouth every 6 (six) hours as needed for anxiety. 90  tablet 3    sertraline (ZOLOFT) 50 MG tablet Take 3 tablets (150 mg total) by mouth daily. For depression 90 tablet 3    traZODone (DESYREL) 150 MG tablet Take 1 tablet (150 mg total) by mouth at bedtime. For insomnia 30 tablet 3     Patient Stressors: Neurosurgeon issue    Patient Strengths: Ability for insight  Average or above average intelligence  General fund of knowledge  Physical Health  Special hobby/interest   Treatment Modalities: Medication Management, Group therapy, Case management,  1 to 1 session with clinician, Psychoeducation, Recreational therapy.   Physician Treatment Plan for Primary Diagnosis: Bipolar I disorder, most recent episode depressed (HCC) Long Term Goal(s): Improvement in symptoms so as ready for discharge   Short Term Goals: Ability to identify and develop effective coping behaviors will improve Ability to maintain clinical measurements within normal limits will improve Compliance with prescribed medications will improve Ability to identify triggers associated with substance abuse/mental health issues will improve Ability to identify changes in lifestyle to reduce recurrence of condition will improve Ability to verbalize feelings will improve Ability to disclose and discuss suicidal ideas Ability to demonstrate self-control will improve  Medication Management: Evaluate patient's response, side effects, and tolerance  of medication regimen.  Therapeutic Interventions: 1 to 1 sessions, Unit Group sessions and Medication administration.  Evaluation of Outcomes: Not Progressing  Physician Treatment Plan for Secondary Diagnosis: Principal Problem:   Bipolar I disorder, most recent episode depressed ([place]) Active Problems:   Posttraumatic stress disorder   Cannabis use disorder, moderate, dependence ([place])   Bipolar 1 disorder, depressed ([place])  Long Term Goal(s): Improvement in symptoms so as ready for discharge   Short Term Goals:  Ability to identify and develop effective coping behaviors will improve Ability to maintain clinical measurements within normal limits will improve Compliance with prescribed medications will improve Ability to identify triggers associated with substance abuse/mental health issues will improve Ability to identify changes in lifestyle to reduce recurrence of condition will improve Ability to verbalize feelings will improve Ability to disclose and discuss suicidal ideas Ability to demonstrate self-control will improve     Medication Management: Evaluate patient's response, side effects, and tolerance of medication regimen.  Therapeutic Interventions: 1 to 1 sessions, Unit Group sessions and Medication administration.  Evaluation of Outcomes: Not Progressing   RN Treatment Plan for Primary Diagnosis: Bipolar I disorder, most recent episode depressed ([place]) Long Term Goal(s): Knowledge of disease and therapeutic regimen to maintain health will improve  Short Term Goals: Ability to remain free from injury will improve, Ability to verbalize frustration and anger appropriately will improve, Ability to demonstrate self-control, Ability to participate in decision making will improve, Ability to verbalize feelings will improve, Ability to disclose and discuss suicidal ideas, Ability to identify and develop effective coping behaviors will improve, and Compliance with prescribed medications will improve  Medication Management: RN will administer medications as ordered by provider, will assess and evaluate patient's response and provide education to patient for prescribed medication. RN will report any adverse and/or side effects to prescribing provider.  Therapeutic Interventions: 1 on 1 counseling sessions, Psychoeducation, Medication administration, Evaluate responses to treatment, Monitor vital signs and CBGs as ordered, Perform/monitor CIWA, COWS, AIMS and Fall Risk screenings as ordered, Perform wound  care treatments as ordered.  Evaluation of Outcomes: Not Progressing   LCSW Treatment Plan for Primary Diagnosis: Bipolar I disorder, most recent episode depressed ([place]) Long Term Goal(s): Safe transition to appropriate next level of care at discharge, Engage patient in therapeutic group addressing interpersonal concerns.  Short Term Goals: Engage patient in aftercare planning with referrals and resources, Increase social support, Increase ability to appropriately verbalize feelings, Increase emotional regulation, Facilitate acceptance of mental health diagnosis and concerns, Facilitate patient progression through stages of change regarding substance use diagnoses and concerns, Identify triggers associated with mental health/substance abuse issues, and Increase skills for wellness and recovery  Therapeutic Interventions: Assess for all discharge needs, 1 to 1 time with Social worker, Explore available resources and support systems, Assess for adequacy in community support network, Educate family and significant other(s) on suicide prevention, Complete Psychosocial Assessment, Interpersonal group therapy.  Evaluation of Outcomes: Not Progressing   Progress in Treatment: Attending groups: No. Participating in groups: No. Taking medication as prescribed: Yes. Toleration medication: Yes. Family/Significant other contact made: Yes, individual(s) contacted:  [name] (Fiance) [phone] Patient understands diagnosis: Yes. Discussing patient identified problems/goals with staff: Yes. Medical problems stabilized or resolved: Yes. Denies suicidal/homicidal ideation: Yes. Issues/concerns per patient self-inventory: No.   New problem(s) identified: No, Describe:  None Reported   New Short Term/Long Term Goal(s): medication stabilization, elimination of SI thoughts, development of comprehensive mental wellness plan.    Patient Goals:  "  Going Home and Medication Management"   Discharge  Plan or Barriers: Patient recently admitted. CSW will continue to follow and assess for appropriate referrals and possible discharge planning.    Reason for Continuation of Hospitalization: Anxiety Depression Medication stabilization Suicidal ideation  Estimated Length of Stay: 3-5 days   Last 3 [place] Suicide Severity Risk Score: [place] Admission (Current) from 06/08/2022 in [facility] ED from 06/07/2022 in [facility] Office Visit from 08/26/2020 in [place] Low Risk Moderate Risk No Risk       Last PHQ 2/9 Scores:    01/31/2022    1:04 PM 10/31/2021    1:30 PM 05/25/2021   11:21 AM  Depression screen PHQ 2/9  Decreased Interest 1 0 0  Down, Depressed, Hopeless 1 1 2   PHQ - 2 Score 2 1 2   Altered sleeping 2 0 0  Tired, decreased energy 0 1 2  Change in appetite 1 0 0  Feeling bad or failure about yourself  0 0 0  Trouble concentrating 1 1 1   Moving slowly or fidgety/restless 1 1 2   Suicidal thoughts 0 0 0  PHQ-9 Score 7 4 7   Difficult doing work/chores Not difficult at all Not difficult at all Somewhat difficult    Scribe for Treatment Team: Charlett Lango 06/09/2022 1:35 PM

## 2022-06-09 NOTE — Progress Notes (Signed)
  Patient stated "I am doing better today." Patient reports anxiety and depression 3/10, and shoulder pain 6/10. Patient denies SI,HI, AVH. Patient tolerated scheduled medications well, no drug adverse reactions reported. PRN Tylenol administered for pain, with relief. Support provided, Q 15 minutes checks continues.   06/08/22 2140  Psych Admission Type (Psych Patients Only)  Admission Status Voluntary  Psychosocial Assessment  Patient Complaints Anxiety;Depression  Eye Contact Avoids  Facial Expression Flat  Affect Blunted  Speech Logical/coherent  Interaction Guarded  Motor Activity Fidgety  Appearance/Hygiene Unremarkable  Behavior Characteristics Cooperative;Appropriate to situation  Mood Depressed;Anxious;Pleasant  Thought Process  Coherency WDL  Content WDL  Delusions WDL  Perception WDL  Hallucination None reported or observed  Judgment Limited  Confusion None  Danger to Self  Current suicidal ideation? Denies  Agreement Not to Harm Self Yes  Description of Agreement Verbal contract  Danger to Others  Danger to Others None reported or observed

## 2022-06-09 NOTE — Progress Notes (Signed)
Patient appears flat. Patient denies SI/HI/AVH. Pt good sleep and appetite. Pt denies anxiety and depression. Patient complied with morning medication with no reported side effects. Patient remains safe on Q47min checks and contracts for safety.      06/09/22 1026  Psych Admission Type (Psych Patients Only)  Admission Status Voluntary  Psychosocial Assessment  Patient Complaints Anxiety;Depression  Eye Contact Avoids  Facial Expression Flat  Affect Blunted  Speech Logical/coherent  Interaction Guarded  Motor Activity Fidgety  Appearance/Hygiene Unremarkable  Behavior Characteristics Cooperative;Anxious  Mood Depressed;Anxious  Thought Process  Coherency WDL  Content WDL  Delusions None reported or observed  Hallucination None reported or observed  Judgment Limited  Confusion None  Danger to Self  Current suicidal ideation? Denies  Agreement Not to Harm Self Yes  Description of Agreement verbal  Danger to Others  Danger to Others None reported or observed

## 2022-06-09 NOTE — Progress Notes (Signed)
[facility] MD Progress Note  06/09/2022 2:27 PM Dylan [name]  MRN:  [id number]  Reason for admission: 47 year old AA male with hx of mental illnesses (bipolar disorder, cannabis use disorder & PTSD). He was a patient here in 2016 for mood stabilization treatments. Patient also indicated that he has been hospitalized at the [facility] in the past for mood stabilization treatments as well. He is being admitted to the [facility] this time around for worsening symptoms of depression triggering suicidal ideations R/T being off of medications for 6 weeks due to financial stressors/unemployment status   Daily notes: Dylan [name] is seen in his room, chart reviewed. The chart findings discussed with the treatment team. She presents alert, oriented & aware of situation. She is visible on the unit, attending group sessions. He presents with an improving affect, good eye contact. He reports, "I'm doing okay. Doing well on the medicines. No side effects. I have been attending group sessions. I'm in bed now because I'm a little tired, that all. I do not have any complaint at this moment. I feel good that I'm back on my medicines". [name] currently denies any SIHI, AVH, delusional thoughts or paranoia. He does not appear to be responding to any internal stimuli. There  is are no changes made on his current plan of care. Will continue as already in progress. Depakote level to be obtained on 06-12-22. Vital signs remain stable.  Principal Problem: Bipolar I disorder, most recent episode depressed ([name])  Diagnosis: Principal Problem:   Bipolar I disorder, most recent episode depressed ([name]) Active Problems:   Posttraumatic stress disorder   Cannabis use disorder, moderate, dependence ([name])   Bipolar 1 disorder, depressed ([name])  Total Time spent with patient:  35 minutes  Past Psychiatric History: See H&P.  Past Medical History:  Past Medical History:  Diagnosis Date   Bipolar 1 disorder ([name])    Medical history  non-contributory    PTSD (post-traumatic stress disorder)     Past Surgical History:  Procedure Laterality Date   NO PAST SURGERIES     Family History: History reviewed. No pertinent family history.  Family Psychiatric  History: See H&P  Social History:  Social History   Substance and Sexual Activity  Alcohol Use Not Currently   Comment: 3 drinks per month     Social History   Substance and Sexual Activity  Drug Use Yes   Types: Marijuana    Social History   Socioeconomic History   Marital status: Single    Spouse name: Not on file   Number of children: Not on file   Years of education: Not on file   Highest education level: Not on file  Occupational History   Not on file  Tobacco Use   Smoking status: Former    Types: Cigars   Smokeless tobacco: Never  Vaping Use   Vaping Use: Never used  Substance and Sexual Activity   Alcohol use: Not Currently    Comment: 3 drinks per month   Drug use: Yes    Types: Marijuana   Sexual activity: Yes    Partners: Female    Comment: fiance  Other Topics Concern   Not on file  Social History Narrative   Not on file   Social Determinants of Health   Financial Resource Strain: Low Risk  (08/19/2020)   Overall Financial Resource Strain (CARDIA)    Difficulty of Paying Living Expenses: Not very hard  Food Insecurity: No Food Insecurity (  06/08/2022)   Hunger Vital Sign    Worried About Running Out of Food in the Last Year: Never true    Ran Out of Food in the Last Year: Never true  Transportation Needs: No Transportation Needs (06/08/2022)   PRAPARE - Administrator, Civil Service (Medical): No    Lack of Transportation (Non-Medical): No  Physical Activity: Sufficiently Active (08/19/2020)   Exercise Vital Sign    Days of Exercise per Week: 7 days    Minutes of Exercise per Session: 50 min  Stress: No Stress Concern Present (09/22/2020)   Dylan [name] of Occupational Health - Occupational Stress Questionnaire     Feeling of Stress : Only a little  Recent Concern: Stress - Stress Concern Present (08/19/2020)   Dylan [name] of Occupational [employer] - Occupational Stress Questionnaire    Feeling of Stress : Rather much  Social Connections: Moderately Isolated (12/15/2020)   Social Connection and Isolation Panel [NHANES]    Frequency of Communication with Friends and Family: Twice a week    Frequency of Social Gatherings with Friends and Family: Three times a week    Attends Religious Services: Never    Active Member of Clubs or Organizations: No    Attends Banker Meetings: Never    Marital Status: Living with partner   Additional Social History:   Sleep: Good  Appetite:  Good  Current Medications: Current Facility-Administered Medications  Medication Dose Route Frequency Provider Last Rate Last Admin   acetaminophen (TYLENOL) tablet 650 mg  650 mg Oral Q6H PRN Lenard Lance, FNP   650 mg at 06/08/22 2138   alum & mag hydroxide-simeth (MAALOX/MYLANTA) 200-200-20 MG/5ML suspension 30 mL  30 mL Oral Q4H PRN Lenard Lance, FNP       divalproex (DEPAKOTE ER) 24 hr tablet 750 mg  750 mg Oral QHS Lenard Lance, FNP   750 mg at 06/08/22 2136   hydrOXYzine (ATARAX) tablet 25 mg  25 mg Oral Q6H PRN Lenard Lance, FNP       hydrOXYzine (ATARAX) tablet 50 mg  50 mg Oral QHS Massengill, Harrold Donath, MD   50 mg at 06/08/22 2137   risperiDONE (RISPERDAL M-TABS) disintegrating tablet 2 mg  2 mg Oral Q8H PRN Massengill, Harrold Donath, MD       And   LORazepam (ATIVAN) tablet 1 mg  1 mg Oral PRN Massengill, Harrold Donath, MD       And   ziprasidone (GEODON) injection 20 mg  20 mg Intramuscular PRN Massengill, Harrold Donath, MD       magnesium hydroxide (MILK OF MAGNESIA) suspension 30 mL  30 mL Oral Daily PRN Lenard Lance, FNP       sertraline (ZOLOFT) tablet 50 mg  50 mg Oral Daily Lenard Lance, FNP   50 mg at 06/09/22 7824    Lab Results:  No results found for this or any previous visit (from the past 48  hour(s)).  Blood Alcohol level:  Lab Results  Component Value Date   [facility] <10 06/07/2022   ETH <5 04/19/2015   Metabolic Disorder Labs: Lab Results  Component Value Date   HGBA1C 5.1 06/07/2022   MPG 99.67 06/07/2022   Lab Results  Component Value Date   PROLACTIN 9.0 06/07/2022   Lab Results  Component Value Date   CHOL 154 06/07/2022   TRIG 88 06/07/2022   HDL 36 (L) 06/07/2022   CHOLHDL 4.3 06/07/2022   VLDL 18 06/07/2022  LDLCALC 100 (H) 06/07/2022   Physical Findings: AIMS:  , ,  ,  ,    CIWA:    COWS:     Musculoskeletal: Strength & Muscle Tone: within normal limits Gait & Station: normal Patient leans: N/A  Psychiatric Specialty Exam:  Presentation  General Appearance:  Appropriate for Environment; Casual; Fairly Groomed  Eye Contact: Good  Speech: Clear and Coherent; Normal Rate  Speech Volume: Normal  Handedness: Right   Mood and Affect  Mood: -- ("Improving")  Affect: Congruent  Thought Process  Thought Processes: Coherent; Goal Directed; Linear  Descriptions of Associations:Intact  Orientation:Full (Time, Place and Person)  Thought Content:Logical  History of Schizophrenia/Schizoaffective disorder:No  Duration of Psychotic Symptoms:No data recorded Hallucinations:Hallucinations: None  Ideas of Reference:None  Suicidal Thoughts:Suicidal Thoughts: No SI Active Intent and/or Plan: Without Plan; Without Intent; Without Means to Carry Out; Without Access to Means  Homicidal Thoughts:Homicidal Thoughts: No   Sensorium  Memory: Immediate Good; Recent Good; Remote Good  Judgment: Good  Insight: Good  Executive Functions  Concentration: Good  Attention Span: Good  Recall: Good  Fund of Knowledge: Good  Language: Good  Psychomotor Activity  Psychomotor Activity:Psychomotor Activity: Normal   Assets  Assets: Communication Skills; Desire for Improvement; Resilience; Social Support  Sleep   Sleep:Sleep: Good Number of Hours of Sleep: 8  Physical Exam: Physical Exam Vitals and nursing note reviewed.  HENT:     Head: Normocephalic.     Nose: Nose normal.  Eyes:     Pupils: Pupils are equal, round, and reactive to light.  Cardiovascular:     Rate and Rhythm: Normal rate.     Pulses: Normal pulses.  Pulmonary:     Effort: Pulmonary effort is normal.  Genitourinary:    Comments: Deferred Musculoskeletal:        General: Normal range of motion.     Cervical back: Normal range of motion.  Skin:    General: Skin is warm and dry.  Neurological:     General: No focal deficit present.     Mental Status: He is alert and oriented to person, place, and time.    Review of Systems  Constitutional:  Negative for chills, diaphoresis and fever.  HENT:  Negative for congestion and sore throat.   Eyes:  Negative for blurred vision.  Respiratory:  Negative for cough, shortness of breath and wheezing.   Cardiovascular:  Negative for chest pain and palpitations.  Gastrointestinal:  Negative for abdominal pain, constipation, diarrhea, heartburn, nausea and vomiting.  Genitourinary:  Negative for dysuria.  Musculoskeletal:  Negative for joint pain and myalgias.  Skin:  Negative for itching and rash.  Neurological:  Negative for dizziness, tingling, tremors, sensory change, speech change, focal weakness, seizures, loss of consciousness, weakness and headaches.  Psychiatric/Behavioral:  Positive for depression ("Improving") and substance abuse (Hx of). Negative for hallucinations, memory loss and suicidal ideas. The patient is not nervous/anxious and does not have insomnia.    Blood pressure 122/74, pulse 89, temperature 98.3 F (36.8 C), temperature source Oral, resp. rate 20, height 6' (1.829 m), weight 84.8 kg, SpO2 100 %. Body mass index is 25.36 kg/m.   Treatment Plan Summary: Daily contact with patient to assess and evaluate symptoms and progress in treatment and Medication  management.   Continue inpatient hospitalization.  Will continue today 06/09/2022 plan as below except where it is noted.   Principal/active diagnoses. Principal Problem: Bipolar I disorder, most recent episode depressed (HCC) Active Problems: Posttraumatic stress disorder  Cannabis use disorder, moderate, dependence (HCC) Bipolar 1 disorder, depressed (HCC)  Plan: -Continue Depakote 750 mg po Q bedtime for mood stabilization.  -Continue Sertraline 50 mg po Q daily for depression.  -Continue Hydroxyzine 50 mg po Q bedtime for insomnia.  -Continue Vistaril 25 mg po tid prn for anxiety.    Labs: Will obtain Depakote level on: 06-12-22.   Agitation protocols. -Continue Risperdal M-tabs 2 mg po Q 8 hours prn.  & -Lorazepam 1 mg po prn for anxiety x 1 dose.  &  -Geodon 20 mg po IM prn x 1 dose.   Other PRNS -Continue Tylenol 650 mg every 6 hours PRN for mild pain -Continue Maalox 30 ml Q 4 hrs PRN for indigestion -Continue MOM 30 ml po Q 6 hrs for constipation   Safety and Monitoring: Voluntary admission to inpatient psychiatric unit for safety, stabilization and treatment Daily contact with patient to assess and evaluate symptoms and progress in treatment Patient's case to be discussed in multi-disciplinary team meeting Observation Level : q15 minute checks Vital signs: q12 hours Precautions: Safety   Discharge Planning: Social work and case management to assist with discharge planning and identification of hospital follow-up needs prior to discharge Estimated LOS: 5-7 days Discharge Concerns: Need to establish a safety plan; Medication compliance and effectiveness Discharge Goals: Return home with outpatient referrals for mental health follow-up including medication management/psychotherapy  Lindell Spar, NP, pmhnp, fnp-bc 06/09/2022, 2:27 PM

## 2022-06-09 NOTE — Group Note (Signed)
Date:  06/09/2022 Time:  10:45 AM  Group Topic/Focus:  Orientation:   The focus of this group is to educate the patient on the purpose and policies of crisis stabilization and provide a format to answer questions about their admission.  The group details unit policies and expectations of patients while admitted.    Participation Level:  Active  Participation Quality:  Appropriate  Affect:  Appropriate  Cognitive:  Appropriate  Insight: Appropriate  Engagement in Group:  Engaged  Modes of Intervention:  Discussion  Additional Comments:     Jerrye Beavers 06/09/2022, 10:45 AM

## 2022-06-09 NOTE — Group Note (Signed)
Recreation Therapy Group Note   Group Topic:Team Building  Group Date: 06/09/2022 Start Time: 0935 End Time: 1020 Facilitators: Anapaola Kinsel-McCall, LRT,CTRS Location: [address] Day[address]   Goal Area(s) Addresses:  Patient will effectively work with peer towards shared goal.  Patient will identify skills used to make activity successful.  Patient will identify how skills used during activity can be used to reach post d/c goals.   Group Description: Straw Bridge. In teams of 3-5, patients were given 15 plastic drinking straws and an equal length of masking tape. Using the materials provided, patients were instructed to build a free standing bridge-like structure to suspend an everyday item (ex: puzzle box) off of the floor or table surface. All materials were required to be used by the team in their design. LRT facilitated post-activity discussion reviewing team process. Patients were encouraged to reflect how the skills used in this activity can be generalized to daily life post discharge.   Affect/Mood: Appropriate   Participation Level: Engaged   Participation Quality: Independent   Behavior: Appropriate   Speech/Thought Process: Focused   Insight: Good   Judgement: Good   Modes of Intervention: STEM Activity   Patient Response to Interventions:  Engaged   Education Outcome:  Acknowledges education and In group clarification offered    Clinical Observations/Individualized Feedback: Pt attended and participated in group session.    Plan: Continue to engage patient in RT group sessions 2-3x/week.   Desi Rowe-McCall, LRT,CTRS 06/09/2022 12:32 PM

## 2022-06-09 NOTE — Plan of Care (Signed)
  Problem: Education: Goal: Utilization of techniques to improve thought processes will improve Outcome: Progressing Goal: Knowledge of the prescribed therapeutic regimen will improve Outcome: Progressing   Problem: Activity: Goal: Interest or engagement in leisure activities will improve Outcome: Progressing Goal: Imbalance in normal sleep/wake cycle will improve Outcome: Progressing   Problem: Coping: Goal: Coping ability will improve Outcome: Progressing Goal: Will verbalize feelings Outcome: Progressing   Problem: Health Behavior/Discharge Planning: Goal: Ability to make decisions will improve Outcome: Progressing Goal: Compliance with therapeutic regimen will improve Outcome: Progressing   Problem: Role Relationship: Goal: Will demonstrate positive changes in social behaviors and relationships Outcome: Progressing   Problem: Safety: Goal: Ability to disclose and discuss suicidal ideas will improve Outcome: Progressing Goal: Ability to identify and utilize support systems that promote safety will improve Outcome: Progressing   Problem: Self-Concept: Goal: Will verbalize positive feelings about self Outcome: Progressing Goal: Level of anxiety will decrease Outcome: Progressing   Problem: Education: Goal: Ability to state activities that reduce stress will improve Outcome: Progressing   Problem: Coping: Goal: Ability to identify and develop effective coping behavior will improve Outcome: Progressing   Problem: Self-Concept: Goal: Ability to identify factors that promote anxiety will improve Outcome: Progressing Goal: Level of anxiety will decrease Outcome: Progressing Goal: Ability to modify response to factors that promote anxiety will improve Outcome: Progressing

## 2022-06-10 DIAGNOSIS — F3130 Bipolar disorder, current episode depressed, mild or moderate severity, unspecified: Secondary | ICD-10-CM | POA: Diagnosis not present

## 2022-06-10 NOTE — Group Note (Signed)
LCSW Group Therapy Note   No social work group was held; the following was provided to each patient  in lieu of in-person group:  Healthy vs. Unhealthy  Coping Skills and Supports   Unhealthy Qualities                                             Healthy Qualities Works (at first) Works   Stops working or starts hurting Continues working  Fast Usually takes time to develop  Easy Often difficult to learn  Often effortless, can be done without thought Usually takes effort, thinking about it  Usually a habit Usually unknown, has to become a habit  Can do alone Often need to reach out for help   Leads to loss Leads to gain         My Unhealthy Coping Skills                                    My Healthy Coping Skills                       My Unhealthy Supports                                           My Healthy Supports                         Romaldo Saville Grossman-Orr, LCSW 06/10/2022  9:19 AM     

## 2022-06-10 NOTE — [facility] Group Notes (Signed)
Group Focus: affirmation, clarity of thought, and goals/reality orientation Treatment Modality:  Psychoeducation Interventions utilized were assignment, group exercise, and support Purpose: To be able to understand and verbalize the reason for their admission to the hospital. To understand that the medication helps with their chemical imbalance but they also need to work on their choices in life. To be challenged to develop a list of 30 positives about themselves. Also introduce the concept that "feelings" are not reality.  Participation Level:did not attend  Charelle Petrakis A 

## 2022-06-10 NOTE — Progress Notes (Signed)
[place] Group Notes:  (Nursing/MHT/Case Management/Adjunct)  Date:  06/10/2022  Time:  2000  Type of Therapy:   wrap up group  Participation Level:  Active  Participation Quality:  Appropriate, Attentive, Sharing, and Supportive  Affect:  Flat  Cognitive:  Alert  Insight:  Improving  Engagement in Group:  Engaged  Modes of Intervention:  Clarification, Education, and Support  Summary of Progress/Problems: Positive thinking and positive change were discussed.   Winfield Rast S 06/10/2022, 9:04 PM

## 2022-06-10 NOTE — Progress Notes (Signed)
   06/10/22 2214  Psych Admission Type (Psych Patients Only)  Admission Status Voluntary  Psychosocial Assessment  Patient Complaints Anxiety  Eye Contact Fair  Facial Expression Flat  Affect Appropriate to circumstance  Speech Logical/coherent  Interaction Guarded  Motor Activity Other (Comment) (WDL)  Appearance/Hygiene Unremarkable  Behavior Characteristics Guarded  Mood Depressed  Thought Process  Coherency WDL  Content WDL  Delusions None reported or observed  Perception WDL  Hallucination None reported or observed  Judgment Limited  Confusion None  Danger to Self  Current suicidal ideation? Denies  Agreement Not to Harm Self Yes  Description of Agreement verbal  Danger to Others  Danger to Others None reported or observed

## 2022-06-10 NOTE — Progress Notes (Addendum)
D: Pt denied SI/HI/AVH this morning. Pt reports that he did not sleep well last night. Pt has remained in his room for most of the day. Pt has been pleasant, calm, and cooperative throughout the shift.   A: RN provided support and encouragement to patient. Pt given scheduled medications as prescribed. Q15 min checks verified for safety.    R: Patient verbally contracts for safety. Patient compliant with medications and treatment plan. Pt is safe on the unit.   06/10/22 1016  Psych Admission Type (Psych Patients Only)  Admission Status Voluntary  Psychosocial Assessment  Patient Complaints Anxiety;Depression  Eye Contact Fair  Facial Expression Flat  Affect Appropriate to circumstance  Speech Logical/coherent  Interaction Guarded  Motor Activity Slow  Appearance/Hygiene Unremarkable  Behavior Characteristics Appropriate to situation  Mood Depressed;Anxious  Thought Process  Coherency WDL  Content WDL  Delusions None reported or observed  Perception WDL  Hallucination None reported or observed  Judgment Limited  Confusion None  Danger to Self  Current suicidal ideation? Denies  Description of Suicide Plan No plan  Agreement Not to Harm Self Yes  Description of Agreement Pt verbally contracts for safety  Danger to Others  Danger to Others None reported or observed

## 2022-06-10 NOTE — Progress Notes (Signed)
[facility] MD Progress Note  06/10/2022 2:25 PM AGAMJOT KILGALLON  MRN:  [id number]  Reason for admission: 47 year old AA male with hx of mental illnesses (bipolar disorder, cannabis use disorder & PTSD). He was a patient here in 2016 for mood stabilization treatments. Patient also indicated that he has been hospitalized at the [facility] in the past for mood stabilization treatments as well. He is being admitted to the [facility] this time around for worsening symptoms of depression triggering suicidal ideations R/T being off of medications for 6 weeks due to financial stressors/unemployment status   Daily notes: "Josemaria is seen, chart reviewed. The chart findings discussed with the treatment team. He presents alert, oriented & aware of situation. He is visible on the unit, attending group sessions. He presents with an improving affect, good eye contact. He reports, "I'm doing well. My mood is improving. I'm taking & tolerating my medicines. Group sessions has been helpful. I'm sleeping well & appetite is good". Aaron currently denies any SIHI, AVH, delusional thoughts or paranoia. He does not appear to be responding to any internal stimuli. There are no changes made on the current plan of care. Will continue as already in progress. Valproic acid will be obtained on Monday 06-12-22.  Principal Problem: Bipolar I disorder, most recent episode depressed (HCC)  Diagnosis: Principal Problem:   Bipolar I disorder, most recent episode depressed (HCC) Active Problems:   Posttraumatic stress disorder   Cannabis use disorder, moderate, dependence (HCC)   Bipolar 1 disorder, depressed (HCC)  Total Time spent with patient:  35 minutes  Past Psychiatric History: See H&P.  Past Medical History:  Past Medical History:  Diagnosis Date   Bipolar 1 disorder (HCC)    Medical history non-contributory    PTSD (post-traumatic stress disorder)     Past Surgical History:  Procedure Laterality Date   NO PAST  SURGERIES     Family History: History reviewed. No pertinent family history.  Family Psychiatric  History: See H&P  Social History:  Social History   Substance and Sexual Activity  Alcohol Use Not Currently   Comment: 3 drinks per month     Social History   Substance and Sexual Activity  Drug Use Yes   Types: Marijuana    Social History   Socioeconomic History   Marital status: Single    Spouse name: Not on file   Number of children: Not on file   Years of education: Not on file   Highest education level: Not on file  Occupational History   Not on file  Tobacco Use   Smoking status: Former    Types: Cigars   Smokeless tobacco: Never  Vaping Use   Vaping Use: Never used  Substance and Sexual Activity   Alcohol use: Not Currently    Comment: 3 drinks per month   Drug use: Yes    Types: Marijuana   Sexual activity: Yes    Partners: Female    Comment: fiance  Other Topics Concern   Not on file  Social History Narrative   Not on file   Social Determinants of Health   Financial Resource Strain: Low Risk  (08/19/2020)   Overall Financial Resource Strain (CARDIA)    Difficulty of Paying Living Expenses: Not very hard  Food Insecurity: No Food Insecurity (06/08/2022)   Hunger Vital Sign    Worried About Running Out of Food in the Last Year: Never true    Ran Out of Food in  the Last Year: Never true  Transportation Needs: No Transportation Needs (06/08/2022)   PRAPARE - Hydrologist (Medical): No    Lack of Transportation (Non-Medical): No  Physical Activity: Sufficiently Active (08/19/2020)   Exercise Vital Sign    Days of Exercise per Week: 7 days    Minutes of Exercise per Session: 50 min  Stress: No Stress Concern Present (09/22/2020)   [place]    Feeling of Stress : Only a little  Recent Concern: Stress - Stress Concern Present (08/19/2020)   [place]    Feeling of Stress : Rather much  Social Connections: Moderately Isolated (12/15/2020)   Social Connection and Isolation Panel [NHANES]    Frequency of Communication with Friends and Family: Twice a week    Frequency of Social Gatherings with Friends and Family: Three times a week    Attends Religious Services: Never    Active Member of Clubs or Organizations: No    Attends Archivist Meetings: Never    Marital Status: Living with partner   Additional Social History:   Sleep: Good  Appetite:  Good  Current Medications: Current Facility-Administered Medications  Medication Dose Route Frequency Provider Last Rate Last Admin   acetaminophen (TYLENOL) tablet 650 mg  650 mg Oral Q6H PRN Lucky Rathke, FNP   650 mg at 06/08/22 2138   alum & mag hydroxide-simeth (MAALOX/MYLANTA) 200-200-20 MG/5ML suspension 30 mL  30 mL Oral Q4H PRN Lucky Rathke, FNP       divalproex (DEPAKOTE ER) 24 hr tablet 750 mg  750 mg Oral QHS Lucky Rathke, FNP   750 mg at 06/09/22 2110   hydrOXYzine (ATARAX) tablet 25 mg  25 mg Oral Q6H PRN Lucky Rathke, FNP       hydrOXYzine (ATARAX) tablet 50 mg  50 mg Oral QHS Massengill, Ovid Curd, MD   50 mg at 06/09/22 2111   risperiDONE (RISPERDAL M-TABS) disintegrating tablet 2 mg  2 mg Oral Q8H PRN Massengill, Ovid Curd, MD       And   LORazepam (ATIVAN) tablet 1 mg  1 mg Oral PRN Massengill, Ovid Curd, MD       And   ziprasidone (GEODON) injection 20 mg  20 mg Intramuscular PRN Massengill, Ovid Curd, MD       magnesium hydroxide (MILK OF MAGNESIA) suspension 30 mL  30 mL Oral Daily PRN Lucky Rathke, FNP       sertraline (ZOLOFT) tablet 50 mg  50 mg Oral Daily Lucky Rathke, FNP   50 mg at 06/10/22 1610    Lab Results:  No results found for this or any previous visit (from the past 24 hour(s)).  Blood Alcohol level:  Lab Results  Component Value Date   ETH <10 06/07/2022   ETH <5 96/08/5407   Metabolic  Disorder Labs: Lab Results  Component Value Date   HGBA1C 5.1 06/07/2022   MPG 99.67 06/07/2022   Lab Results  Component Value Date   PROLACTIN 9.0 06/07/2022   Lab Results  Component Value Date   CHOL 154 06/07/2022   TRIG 88 06/07/2022   HDL 36 (L) 06/07/2022   CHOLHDL 4.3 06/07/2022   VLDL 18 06/07/2022   LDLCALC 100 (H) 06/07/2022   Physical Findings: AIMS:  , ,  ,  ,    CIWA:    COWS:     Musculoskeletal:  Strength & Muscle Tone: within normal limits Gait & Station: normal Patient leans: N/A  Psychiatric Specialty Exam:  Presentation  General Appearance:  Appropriate for Environment; Casual; Fairly Groomed  Eye Contact: Good  Speech: Clear and Coherent; Normal Rate  Speech Volume: Normal  Handedness: Right   Mood and Affect  Mood: -- ("Improving")  Affect: Congruent  Thought Process  Thought Processes: Coherent; Goal Directed; Linear  Descriptions of Associations:Intact  Orientation:Full (Time, Place and Person)  Thought Content:Logical  History of Schizophrenia/Schizoaffective disorder:No  Duration of Psychotic Symptoms:No data recorded Hallucinations:Hallucinations: None  Ideas of Reference:None  Suicidal Thoughts:Suicidal Thoughts: No SI Active Intent and/or Plan: Without Plan; Without Intent; Without Means to Carry Out; Without Access to Means  Homicidal Thoughts:Homicidal Thoughts: No   Sensorium  Memory: Immediate Good; Recent Good; Remote Good  Judgment: Good  Insight: Good  Executive Functions  Concentration: Good  Attention Span: Good  Recall: Good  Fund of Knowledge: Good  Language: Good  Psychomotor Activity  Psychomotor Activity:Psychomotor Activity: Normal   Assets  Assets: Communication Skills; Desire for Improvement; Resilience; Social Support  Sleep  Sleep:Sleep: Good Number of Hours of Sleep: 8  Physical Exam: Physical Exam Vitals and nursing note reviewed.  HENT:     Head:  Normocephalic.     Nose: Nose normal.  Eyes:     Pupils: Pupils are equal, round, and reactive to light.  Cardiovascular:     Rate and Rhythm: Normal rate.     Pulses: Normal pulses.  Pulmonary:     Effort: Pulmonary effort is normal.  Genitourinary:    Comments: Deferred Musculoskeletal:        General: Normal range of motion.     Cervical back: Normal range of motion.  Skin:    General: Skin is warm and dry.  Neurological:     General: No focal deficit present.     Mental Status: He is alert and oriented to person, place, and time.    Review of Systems  Constitutional:  Negative for chills, diaphoresis and fever.  HENT:  Negative for congestion and sore throat.   Eyes:  Negative for blurred vision.  Respiratory:  Negative for cough, shortness of breath and wheezing.   Cardiovascular:  Negative for chest pain and palpitations.  Gastrointestinal:  Negative for abdominal pain, constipation, diarrhea, heartburn, nausea and vomiting.  Genitourinary:  Negative for dysuria.  Musculoskeletal:  Negative for joint pain and myalgias.  Skin:  Negative for itching and rash.  Neurological:  Negative for dizziness, tingling, tremors, sensory change, speech change, focal weakness, seizures, loss of consciousness, weakness and headaches.  Psychiatric/Behavioral:  Positive for depression ("Improving") and substance abuse (Hx of). Negative for hallucinations, memory loss and suicidal ideas. The patient is not nervous/anxious and does not have insomnia.    Blood pressure 112/62, pulse 78, temperature 98.3 F (36.8 C), temperature source Oral, resp. rate 16, height 6' (1.829 m), weight 84.8 kg, SpO2 100 %. Body mass index is 25.36 kg/m.   Treatment Plan Summary: Daily contact with patient to assess and evaluate symptoms and progress in treatment and Medication management.   Continue inpatient hospitalization.  Will continue today 06/10/2022 plan as below except where it is noted.    Principal/active diagnoses. Principal Problem: Bipolar I disorder, most recent episode depressed ([place]) Active Problems: Posttraumatic stress disorder Cannabis use disorder, moderate, dependence (HCC) Bipolar 1 disorder, depressed (HCC)  Plan: -Continue Depakote 750 mg po Q bedtime for mood stabilization.  -Continue Sertraline 50 mg po  Q daily for depression.  -Continue Hydroxyzine 50 mg po Q bedtime for insomnia.  -Continue Vistaril 25 mg po tid prn for anxiety.    Labs: Will obtain Depakote level on: 06-12-22.   Agitation protocols. -Continue Risperdal M-tabs 2 mg po Q 8 hours prn.  & -Lorazepam 1 mg po prn for anxiety x 1 dose.  &  -Geodon 20 mg po IM prn x 1 dose.   Other PRNS -Continue Tylenol 650 mg every 6 hours PRN for mild pain -Continue Maalox 30 ml Q 4 hrs PRN for indigestion -Continue MOM 30 ml po Q 6 hrs for constipation   Safety and Monitoring: Voluntary admission to inpatient psychiatric unit for safety, stabilization and treatment Daily contact with patient to assess and evaluate symptoms and progress in treatment Patient's case to be discussed in multi-disciplinary team meeting Observation Level : q15 minute checks Vital signs: q12 hours Precautions: Safety   Discharge Planning: Social work and case management to assist with discharge planning and identification of hospital follow-up needs prior to discharge Estimated LOS: 5-7 days Discharge Concerns: Need to establish a safety plan; Medication compliance and effectiveness Discharge Goals: Return home with outpatient referrals for mental health follow-up including medication management/psychotherapy  Armandina Stammer, NP, pmhnp, fnp-bc 06/10/2022, 2:25 PMPatient ID: [name], male   DOB: 04/20/1976, 47 y.o.   MRN: [id number]

## 2022-06-10 NOTE — [facility] Group Notes (Signed)
.  Psychoeducational Group Note    Date:  06/10/2022 Time:1300-1400    Purpose of Group: . The group focus' on teaching patients on how to identify their needs and their Life Skills:  A group where two lists are made. What people need and what are things that we do that are unhealthy. The lists are developed by the patients and it is explained that we often do the actions that are not healthy to get our list of needs met.  Goal:: to develop the coping skills needed to get their needs met  Participation Level:  did not attend [name] A     

## 2022-06-11 DIAGNOSIS — F3130 Bipolar disorder, current episode depressed, mild or moderate severity, unspecified: Secondary | ICD-10-CM | POA: Diagnosis not present

## 2022-06-11 NOTE — Progress Notes (Signed)
The focus of this group is to help patients review their daily goal of treatment and discuss progress on daily workbooks. Pt did not attend the evening group. 

## 2022-06-11 NOTE — Progress Notes (Signed)
   06/11/22 2249  Psych Admission Type (Psych Patients Only)  Admission Status Voluntary  Psychosocial Assessment  Patient Complaints Depression  Eye Contact Fair  Facial Expression Flat  Affect Appropriate to circumstance  Speech Logical/coherent  Interaction Guarded  Motor Activity Other (Comment) (WDL)  Behavior Characteristics Appropriate to situation  Mood Depressed  Thought Process  Coherency WDL  Content WDL  Delusions None reported or observed  Perception WDL  Hallucination None reported or observed  Judgment Limited  Confusion None  Danger to Self  Current suicidal ideation? Denies  Agreement Not to Harm Self Yes  Description of Agreement verbal  Danger to Others  Danger to Others None reported or observed

## 2022-06-11 NOTE — [facility] Group Notes (Signed)
Adult Psychoeducational Group  Date:  06/11/2022 Time: 1300-1400  Group Topic/Focus: Continuation of the group from Saturday. Looking at the lists that were created and talking about what needs to be done with the homework of 30 positives about themselves.                                     Talking about taking their power back and helping themselves to develop a positive self esteem.      Participation Quality:  did not attend  Aqua Denslow A  

## 2022-06-11 NOTE — Progress Notes (Signed)
[facility] MD Progress Note  06/11/2022 1:47 PM Dylan [name]  MRN:  [id number]  Reason for admission: 47 year old AA male with hx of mental illnesses (bipolar disorder, cannabis use disorder & PTSD). He was a patient here in 2016 for mood stabilization treatments. Patient also indicated that he has been hospitalized at the [facility] in the past for mood stabilization treatments as well. He is being admitted to the [facility] this time around for worsening symptoms of depression triggering suicidal ideations R/T being off of medications for 6 weeks due to financial stressors/unemployment status   Daily notes: Wahid is seen, chart reviewed. The chart findings discussed with the treatment team. He presents alert, oriented & aware of situation. He is visible on the unit, attending group sessions. He says he is doing much better. He denies any new issues or concerns. He is taking & tolerating his treatment regimen. Denies any side effects.There are no changes made on the current plan of care. Will continue as already in progress. Valproic acid will be obtained on Monday 06-12-22. He is scheduled for discharge tomorrow morning. Patient will be going home. Vital signs remain stable.  Principal Problem: Bipolar I disorder, most recent episode depressed (HCC)  Diagnosis: Principal Problem:   Bipolar I disorder, most recent episode depressed (HCC) Active Problems:   Posttraumatic stress disorder   Cannabis use disorder, moderate, dependence (HCC)   Bipolar 1 disorder, depressed (HCC)  Total Time spent with patient:  35 minutes  Past Psychiatric History: See H&P.  Past Medical History:  Past Medical History:  Diagnosis Date   Bipolar 1 disorder (HCC)    Medical history non-contributory    PTSD (post-traumatic stress disorder)     Past Surgical History:  Procedure Laterality Date   NO PAST SURGERIES     Family History: History reviewed. No pertinent family history.  Family Psychiatric   History: See H&P  Social History:  Social History   Substance and Sexual Activity  Alcohol Use Not Currently   Comment: 3 drinks per month     Social History   Substance and Sexual Activity  Drug Use Yes   Types: Marijuana    Social History   Socioeconomic History   Marital status: Single    Spouse name: Not on file   Number of children: Not on file   Years of education: Not on file   Highest education level: Not on file  Occupational History   Not on file  Tobacco Use   Smoking status: Former    Types: Cigars   Smokeless tobacco: Never  Vaping Use   Vaping Use: Never used  Substance and Sexual Activity   Alcohol use: Not Currently    Comment: 3 drinks per month   Drug use: Yes    Types: Marijuana   Sexual activity: Yes    Partners: Female    Comment: fiance  Other Topics Concern   Not on file  Social History Narrative   Not on file   Social Determinants of Health   Financial Resource Strain: Low Risk  (08/19/2020)   Overall Financial Resource Strain (CARDIA)    Difficulty of Paying Living Expenses: Not very hard  Food Insecurity: No Food Insecurity (06/08/2022)   Hunger Vital Sign    Worried About Running Out of Food in the Last Year: Never true    Ran Out of Food in the Last Year: Never true  Transportation Needs: No Transportation Needs (06/08/2022)   PRAPARE -  Hydrologist (Medical): No    Lack of Transportation (Non-Medical): No  Physical Activity: Sufficiently Active (08/19/2020)   Exercise Vital Sign    Days of Exercise per Week: 7 days    Minutes of Exercise per Session: 50 min  Stress: No Stress Concern Present (09/22/2020)   [place]    Feeling of Stress : Only a little  Recent Concern: Stress - Stress Concern Present (08/19/2020)   [place]    Feeling of Stress : Rather much  Social  Connections: Moderately Isolated (12/15/2020)   Social Connection and Isolation Panel [NHANES]    Frequency of Communication with Friends and Family: Twice a week    Frequency of Social Gatherings with Friends and Family: Three times a week    Attends Religious Services: Never    Active Member of Clubs or Organizations: No    Attends Archivist Meetings: Never    Marital Status: Living with partner   Additional Social History:   Sleep: Good  Appetite:  Good  Current Medications: Current Facility-Administered Medications  Medication Dose Route Frequency Provider Last Rate Last Admin   acetaminophen (TYLENOL) tablet 650 mg  650 mg Oral Q6H PRN Lucky Rathke, FNP   650 mg at 06/08/22 2138   alum & mag hydroxide-simeth (MAALOX/MYLANTA) 200-200-20 MG/5ML suspension 30 mL  30 mL Oral Q4H PRN Lucky Rathke, FNP       divalproex (DEPAKOTE ER) 24 hr tablet 750 mg  750 mg Oral QHS Lucky Rathke, FNP   750 mg at 06/10/22 2122   hydrOXYzine (ATARAX) tablet 25 mg  25 mg Oral Q6H PRN Lucky Rathke, FNP       hydrOXYzine (ATARAX) tablet 50 mg  50 mg Oral QHS Massengill, Ovid Curd, MD   50 mg at 06/10/22 2123   risperiDONE (RISPERDAL M-TABS) disintegrating tablet 2 mg  2 mg Oral Q8H PRN Massengill, Ovid Curd, MD       And   LORazepam (ATIVAN) tablet 1 mg  1 mg Oral PRN Massengill, Ovid Curd, MD       And   ziprasidone (GEODON) injection 20 mg  20 mg Intramuscular PRN Massengill, Ovid Curd, MD       magnesium hydroxide (MILK OF MAGNESIA) suspension 30 mL  30 mL Oral Daily PRN Lucky Rathke, FNP       sertraline (ZOLOFT) tablet 50 mg  50 mg Oral Daily Lucky Rathke, FNP   50 mg at 06/11/22 1191    Lab Results:  No results found for this or any previous visit (from the past 37 hour(s)).  Blood Alcohol level:  Lab Results  Component Value Date   ETH <10 06/07/2022   ETH <5 47/82/9562   Metabolic Disorder Labs: Lab Results  Component Value Date   HGBA1C 5.1 06/07/2022   MPG 99.67 06/07/2022    Lab Results  Component Value Date   PROLACTIN 9.0 06/07/2022   Lab Results  Component Value Date   CHOL 154 06/07/2022   TRIG 88 06/07/2022   HDL 36 (L) 06/07/2022   CHOLHDL 4.3 06/07/2022   VLDL 18 06/07/2022   LDLCALC 100 (H) 06/07/2022   Physical Findings: AIMS:  , ,  ,  ,    CIWA:    COWS:     Musculoskeletal: Strength & Muscle Tone: within normal limits Gait & Station: normal Patient leans: N/A  Psychiatric  Specialty Exam:  Presentation  General Appearance:  Appropriate for Environment; Casual; Fairly Groomed  Eye Contact: Good  Speech: Clear and Coherent; Normal Rate  Speech Volume: Normal  Handedness: Right   Mood and Affect  Mood: -- (Continues to improve.)  Affect: Congruent  Thought Process  Thought Processes: Coherent; Goal Directed; Linear  Descriptions of Associations:Intact  Orientation:Full (Time, Place and Person)  Thought Content:Logical  History of Schizophrenia/Schizoaffective disorder:No  Duration of Psychotic Symptoms:No data recorded Hallucinations:Hallucinations: None   Ideas of Reference:None  Suicidal Thoughts:Suicidal Thoughts: No SI Active Intent and/or Plan: Without Intent; Without Plan; Without Means to Carry Out; Without Access to Means   Homicidal Thoughts:Homicidal Thoughts: No    Sensorium  Memory: Immediate Good; Recent Good; Remote Good  Judgment: Good  Insight: Good  Executive Functions  Concentration: Good  Attention Span: Good  Recall: Good  Fund of Knowledge: Good  Language: Good  Psychomotor Activity  Psychomotor Activity:Psychomotor Activity: Normal    Assets  Assets: Communication Skills; Desire for Improvement; Housing; Resilience; Physical Health; Social Support  Sleep  Sleep:Sleep: Good Number of Hours of Sleep: 8   Physical Exam: Physical Exam Vitals and nursing note reviewed.  HENT:     Head: Normocephalic.     Nose: Nose normal.  Eyes:      Pupils: Pupils are equal, round, and reactive to light.  Cardiovascular:     Rate and Rhythm: Normal rate.     Pulses: Normal pulses.  Pulmonary:     Effort: Pulmonary effort is normal.  Genitourinary:    Comments: Deferred Musculoskeletal:        General: Normal range of motion.     Cervical back: Normal range of motion.  Skin:    General: Skin is warm and dry.  Neurological:     General: No focal deficit present.     Mental Status: He is alert and oriented to person, place, and time.    Review of Systems  Constitutional:  Negative for chills, diaphoresis and fever.  HENT:  Negative for congestion and sore throat.   Eyes:  Negative for blurred vision.  Respiratory:  Negative for cough, shortness of breath and wheezing.   Cardiovascular:  Negative for chest pain and palpitations.  Gastrointestinal:  Negative for abdominal pain, constipation, diarrhea, heartburn, nausea and vomiting.  Genitourinary:  Negative for dysuria.  Musculoskeletal:  Negative for joint pain and myalgias.  Skin:  Negative for itching and rash.  Neurological:  Negative for dizziness, tingling, tremors, sensory change, speech change, focal weakness, seizures, loss of consciousness, weakness and headaches.  Psychiatric/Behavioral:  Positive for depression ("Improving") and substance abuse (Hx of). Negative for hallucinations, memory loss and suicidal ideas. The patient is not nervous/anxious and does not have insomnia.    Blood pressure 108/72, pulse 97, temperature 98 F (36.7 C), temperature source Oral, resp. rate 16, height 6' (1.829 m), weight 84.8 kg, SpO2 98 %. Body mass index is 25.36 kg/m.   Treatment Plan Summary: Daily contact with patient to assess and evaluate symptoms and progress in treatment and Medication management.   Continue inpatient hospitalization.  Will continue today 06/11/2022 plan as below except where it is noted.   Principal/active diagnoses. Principal Problem: Bipolar I  disorder, most recent episode depressed (HCC) Active Problems: Posttraumatic stress disorder Cannabis use disorder, moderate, dependence (HCC) Bipolar 1 disorder, depressed (HCC)  Plan: -Continue Depakote 750 mg po Q bedtime for mood stabilization.  -Continue Sertraline 50 mg po Q daily for depression.  -Continue  Hydroxyzine 50 mg po Q bedtime for insomnia.  -Continue Vistaril 25 mg po tid prn for anxiety.    Labs: Will obtain Depakote level on: 06-12-22.   Agitation protocols. -Continue Risperdal M-tabs 2 mg po Q 8 hours prn.  & -Lorazepam 1 mg po prn for anxiety x 1 dose.  &  -Geodon 20 mg po IM prn x 1 dose.   Other PRNS -Continue Tylenol 650 mg every 6 hours PRN for mild pain -Continue Maalox 30 ml Q 4 hrs PRN for indigestion -Continue MOM 30 ml po Q 6 hrs for constipation   Safety and Monitoring: Voluntary admission to inpatient psychiatric unit for safety, stabilization and treatment Daily contact with patient to assess and evaluate symptoms and progress in treatment Patient's case to be discussed in multi-disciplinary team meeting Observation Level : q15 minute checks Vital signs: q12 hours Precautions: Safety   Discharge Planning: Social work and case management to assist with discharge planning and identification of hospital follow-up needs prior to discharge Estimated LOS: 5-7 days Discharge Concerns: Need to establish a safety plan; Medication compliance and effectiveness Discharge Goals: Return home with outpatient referrals for mental health follow-up including medication management/psychotherapy  Lindell Spar, NP, pmhnp, fnp-bc 06/11/2022, 1:47 PMPatient ID: [name] Patient, male   DOB: 1975-12-03, 47 y.o.   MRN: [id number] Patient ID: CORNELL BOURBON, male   DOB: 1975/12/30, 47 y.o.   MRN: [id number]

## 2022-06-11 NOTE — Progress Notes (Signed)
D:  Patient denied SI and HI, contracts for safety.  Denied A/V hallucination. A:  Medications administered per MD orders.  Emotional support and encouragement given patient. R:  Safety maintained with 15 minute  checks. Patient stated he did not sleep well last night.

## 2022-06-11 NOTE — [facility] Group Notes (Signed)
Adult Psychoeducational Group Note Date:  06/11/2022 Time:  0900-1000 Group Topic/Focus: PROGRESSIVE RELAXATION. A group where deep breathing is taught and tensing and relaxation muscle groups is used. Imagery is used as well.  Pts are asked to imagine 3 pillars that hold them up when they are not able to hold themselves up and to share that with the group.   Participation Level:  did not attend  : Saree Krogh A    

## 2022-06-11 NOTE — Plan of Care (Signed)
Nurse discussed anxiety, depression and coping skills with patient.  

## 2022-06-11 NOTE — Group Note (Signed)
LCSW Group Therapy Note   Group Date: 06/11/2022 Start Time: 6389 End Time: 1145   Type of Therapy and Topic:  Group Therapy:  Communication  Participation Level:  Did Not Attend   Description of Group:    In this group patients will be encouraged to explore how individuals communicate with one another appropriately and inappropriately. Patients will be guided to discuss their thoughts, feelings, and behaviors related to barriers communicating feelings, needs, and stressors. The group will process together ways to execute positive and appropriate communications, with attention given to how one use behavior, tone, and body language to communicate. Patient will be encouraged to reflect on an incident where they were successfully able to communicate and the factors that they believe helped them to communicate. Each patient will be encouraged to identify specific changes they are motivated to make in order to overcome communication barriers with self, peers, authority, and parents. This group will be process-oriented, with patients participating in exploration of their own experiences as well as giving and receiving support and challenging self as well as other group members.  Therapeutic Goals: Patient will identify how people communicate (body language, facial expression, and electronics) Also discuss tone, voice and how these impact what is communicated and how the message is perceived.  Patient will identify feelings (such as fear or worry), thought process and behaviors related to why people internalize feelings rather than express self openly. Patient will identify two changes they are willing to make to overcome communication barriers. Members will then practice through Role Play how to communicate by utilizing psycho-education material (such as I Feel statements and acknowledging feelings rather than displacing on others)   Summary of Patient Progress Did Not Attend     Therapeutic  Modalities:   Cognitive Behavioral Therapy Solution Focused Therapy Motivational Interviewing Family Systems Approach    Charlett Lango 06/11/2022  1:43 PM

## 2022-06-12 ENCOUNTER — Telehealth (INDEPENDENT_AMBULATORY_CARE_PROVIDER_SITE_OTHER): Payer: Commercial Managed Care - HMO | Admitting: Psychiatry

## 2022-06-12 ENCOUNTER — Encounter (HOSPITAL_COMMUNITY): Payer: Self-pay | Admitting: Psychiatry

## 2022-06-12 ENCOUNTER — Other Ambulatory Visit: Payer: Self-pay

## 2022-06-12 DIAGNOSIS — F411 Generalized anxiety disorder: Secondary | ICD-10-CM

## 2022-06-12 DIAGNOSIS — F3130 Bipolar disorder, current episode depressed, mild or moderate severity, unspecified: Secondary | ICD-10-CM | POA: Diagnosis not present

## 2022-06-12 DIAGNOSIS — F4310 Post-traumatic stress disorder, unspecified: Secondary | ICD-10-CM

## 2022-06-12 LAB — VALPROIC ACID LEVEL: Valproic Acid Lvl: 57 ug/mL (ref 50.0–100.0)

## 2022-06-12 MED ORDER — DIVALPROEX SODIUM ER 250 MG PO TB24: 750.0000 mg | ORAL_TABLET | Freq: Every day | ORAL | 0 refills | Status: DC

## 2022-06-12 MED ORDER — HYDROXYZINE HCL 50 MG PO TABS: 50.0000 mg | ORAL_TABLET | Freq: Three times a day (TID) | ORAL | 3 refills | Status: DC | PRN

## 2022-06-12 MED ORDER — DIVALPROEX SODIUM ER 250 MG PO TB24
750.0000 mg | ORAL_TABLET | Freq: Every day | ORAL | Status: DC
  Filled 2022-06-12: qty 21

## 2022-06-12 MED ORDER — SERTRALINE HCL 50 MG PO TABS
50.0000 mg | ORAL_TABLET | Freq: Every day | ORAL | 3 refills | Status: DC
  Filled 2022-06-12: qty 30, 30d supply, fill #0

## 2022-06-12 MED ORDER — SERTRALINE HCL 50 MG PO TABS: 50.0000 mg | ORAL_TABLET | Freq: Every day | ORAL | 0 refills | Status: DC

## 2022-06-12 MED ORDER — DIVALPROEX SODIUM ER 250 MG PO TB24: 750.0000 mg | ORAL_TABLET | Freq: Every day | ORAL | 3 refills | Status: DC

## 2022-06-12 MED ORDER — HYDROXYZINE HCL 50 MG PO TABS: 50.0000 mg | ORAL_TABLET | Freq: Every day | ORAL | 0 refills | Status: DC

## 2022-06-12 NOTE — Discharge Summary (Addendum)
Physician Discharge Summary Note  Patient:  Dylan [name] is an 47 y.o., male  MRN:  [id number]  DOB:  [birth year]  Patient phone:  [phone] (home)   Patient address:   [address] [place] [address],  Total Time spent with patient: 30 minutes  Date of Admission:  06/08/2022 Date of Discharge:   06/12/2022  Reason for Admission: Dylan [name]  is a 47 year old AA male with hx of mental illnesses (bipolar disorder, cannabis use disorder & PTSD) who is  admitted to the [facility] from [facility] for worsening symptoms of depression triggering suicidal ideations R/T being off from his medications for 6 weeks due to financial stressors/unemployment status.  Principal Problem: Bipolar I disorder, most recent episode depressed (HCC)  Discharge Diagnoses: Principal Problem:   Bipolar I disorder, most recent episode depressed (HCC) Active Problems:   Posttraumatic stress disorder   Cannabis use disorder, moderate, dependence (HCC)   Bipolar 1 disorder, depressed (HCC)  Past Psychiatric History:  Bipolar disorder, depressed episodes, cannabis use disorder, been hospitalized here at [facility], 2016, [facility], [facility].    Past Medical History:  Past Medical History:  Diagnosis Date   Bipolar 1 disorder (HCC)    Medical history non-contributory    PTSD (post-traumatic stress disorder)     Past Surgical History:  Procedure Laterality Date   NO PAST SURGERIES     Family History: History reviewed. No pertinent family history.  Family Psychiatric  History: Major depressive disorder: Mother.   Social History:  Social History   Substance and Sexual Activity  Alcohol Use Not Currently   Comment: 3 drinks per month     Social History   Substance and Sexual Activity  Drug Use Yes   Types: Marijuana    Social History   Socioeconomic History   Marital status: Single    Spouse name: Not on file   Number of children: Not on file   Years of education: Not on file   Highest  education level: Not on file  Occupational History   Not on file  Tobacco Use   Smoking status: Former    Types: Cigars   Smokeless tobacco: Never  Vaping Use   Vaping Use: Never used  Substance and Sexual Activity   Alcohol use: Not Currently    Comment: 3 drinks per month   Drug use: Yes    Types: Marijuana   Sexual activity: Yes    Partners: Female    Comment: fiance  Other Topics Concern   Not on file  Social History Narrative   Not on file   Social Determinants of Health   Financial Resource Strain: Low Risk  (08/19/2020)   Overall Financial Resource Strain (CARDIA)    Difficulty of Paying Living Expenses: Not very hard  Food Insecurity: No Food Insecurity (06/08/2022)   Hunger Vital Sign    Worried About Running Out of Food in the Last Year: Never true    Ran Out of Food in the Last Year: Never true  Transportation Needs: No Transportation Needs (06/08/2022)   PRAPARE - Administrator, Civil Service (Medical): No    Lack of Transportation (Non-Medical): No  Physical Activity: Sufficiently Active (08/19/2020)   Exercise Vital Sign    Days of Exercise per Week: 7 days    Minutes of Exercise per Session: 50 min  Stress: No Stress Concern Present (09/22/2020)   [employer] of Occupational Health - Occupational Stress Questionnaire  Feeling of Stress : Only a little  Recent Concern: Stress - Stress Concern Present (08/19/2020)   [employer] of Occupational Health - Occupational Stress Questionnaire    Feeling of Stress : Rather much  Social Connections: Moderately Isolated (12/15/2020)   Social Connection and Isolation Panel [NHANES]    Frequency of Communication with Friends and Family: Twice a week    Frequency of Social Gatherings with Friends and Family: Three times a week    Attends Religious Services: Never    Active Member of Clubs or Organizations: No    Attends Banker Meetings: Never    Marital Status: Living with partner     Hospital Course:  HOSPITAL COURSE:  During the patient's hospitalization, patient had extensive initial psychiatric evaluation, and follow-up psychiatric evaluations every day.  Psychiatric diagnoses provided upon initial assessment:  Bipolar 1 disorder, most recent episode depressed Posttraumatic stress disorder Cannabis use disorder, moderate, dependence Bipolar 1 disorder, depressed  Patient's psychiatric medications were adjusted on admission:  -- START on Depakote ER 750 mg p.o. q. bedtime for mood stabilization -- START on sertraline 50 mg p.o. daily for depression -- START hydroxyzine 50 mg p.o. q. bedtime for insomnia -- START Vistaril 25 mg p.o. 3 times daily as needed for anxiety  During the hospitalization, other adjustments were made to the patient's psychiatric medication regimen:  NONE  Patient's care was discussed during the interdisciplinary team meeting every day during the hospitalization.  The patient denies having side effects to prescribed psychiatric medication.  Gradually, patient started adjusting to milieu. The patient was evaluated each day by a clinical provider to ascertain response to treatment. Improvement was noted by the patient's report of decreasing symptoms, improved sleep and appetite, affect, medication tolerance, behavior, and participation in unit programming.  Patient was asked each day to complete a self inventory noting mood, mental status, pain, new symptoms, anxiety and concerns.    Symptoms were reported as significantly decreased or resolved completely by discharge.   On day of discharge, the patient reports that his mood is stable. The patient denied having suicidal thoughts for more than 48 hours prior to discharge.  Patient denies having homicidal thoughts.  Patient denies having auditory hallucinations.  Patient denies any visual hallucinations or other symptoms of psychosis. The patient was motivated to continue taking medication  with a goal of continued improvement in mental health.   The patient reports their target psychiatric symptoms of depression and suicidal thoughts responded well to the psychiatric medications, and the patient reports overall benefit other psychiatric hospitalization. Supportive psychotherapy was provided to the patient. The patient also participated in regular group therapy while hospitalized. Coping skills, problem solving as well as relaxation therapies were also part of the unit programming.  Labs were reviewed with the patient, and abnormal results were discussed with the patient.  The patient is able to verbalize their individual safety plan to this provider.  # It is recommended to the patient to continue psychiatric medications as prescribed, after discharge from the hospital.    # It is recommended to the patient to follow up with your outpatient psychiatric provider and PCP.  # It was discussed with the patient, the impact of alcohol, drugs, tobacco have been there overall psychiatric and medical wellbeing, and total abstinence from substance use was recommended to the patient.  # Prescriptions provided or sent directly to preferred pharmacy at discharge. Patient agreeable to plan. Given opportunity to ask questions. Appears to feel comfortable with  discharge.    # In the event of worsening symptoms, the patient is instructed to call the crisis hotline, 911 and or go to the nearest ED for appropriate evaluation and treatment of symptoms. To follow-up with primary care provider for other medical issues, concerns and or health care needs  # [facility], with a plan to follow up as noted below.   Physical Findings: AIMS:  , ,  ,  ,    CIWA:    COWS:     Musculoskeletal: Strength & Muscle Tone: within normal limits Gait & Station: normal Patient leans: N/A  Psychiatric Specialty Exam:  Presentation  General Appearance:  Casual  Eye  Contact: Good  Speech: Clear and Coherent; Normal Rate  Speech Volume: Normal  Handedness: Right  Mood and Affect  Mood: Euthymic  Affect: Appropriate; Congruent; Full Range  Thought Process  Thought Processes: Linear  Descriptions of Associations:Intact  Orientation:Full (Time, Place and Person)  Thought Content:Logical  History of Schizophrenia/Schizoaffective disorder:No  Duration of Psychotic Symptoms:No data recorded Hallucinations:Hallucinations: None  Ideas of Reference:None  Suicidal Thoughts:Suicidal Thoughts: No SI Active Intent and/or Plan: Without Intent; Without Plan; Without Means to Carry Out; Without Access to Means  Homicidal Thoughts:Homicidal Thoughts: No  Sensorium  Memory: Immediate Good; Recent Good; Remote Good  Judgment: Fair  Insight: Fair  Materials engineer: Fair  Attention Span: Fair  Recall: [employer] of Knowledge: Fair  Language: Fair  Psychomotor Activity  Psychomotor Activity: Psychomotor Activity: Normal  Assets  Assets: Armed forces logistics/support/administrative officer; Desire for Improvement; Housing; Resilience; Physical Health; Social Support  Sleep  Sleep: Sleep: Fair Number of Hours of Sleep: 8  Physical Exam: Physical Exam Vitals and nursing note reviewed.  Constitutional:      Appearance: Normal appearance.  HENT:     Head: Normocephalic.     Nose: Nose normal.     Mouth/Throat:     Mouth: Mucous membranes are moist.     Pharynx: Oropharynx is clear.  Eyes:     Conjunctiva/sclera: Conjunctivae normal.     Pupils: Pupils are equal, round, and reactive to light.  Cardiovascular:     Rate and Rhythm: Normal rate.     Pulses: Normal pulses.  Pulmonary:     Effort: Pulmonary effort is normal.  Abdominal:     Palpations: Abdomen is soft.  Genitourinary:    Comments: Deferred Musculoskeletal:        General: Normal range of motion.     Cervical back: Normal range of motion.  Skin:     General: Skin is warm.  Neurological:     General: No focal deficit present.     Mental Status: He is alert and oriented to person, place, and time.  Psychiatric:        Mood and Affect: Mood normal.        Behavior: Behavior normal.        Thought Content: Thought content normal.    Review of Systems  Neurological:  Negative for tingling, tremors, sensory change and headaches.  Psychiatric/Behavioral:  Positive for depression (Stable with medication) and suicidal ideas (Patient denies). The patient is nervous/anxious (Stable with medication).    Blood pressure 117/79, pulse 81, temperature 98 F (36.7 C), temperature source Oral, resp. rate 16, height 6' (1.829 m), weight 84.8 kg, SpO2 99 %. Body mass index is 25.36 kg/m.   Social History   Tobacco Use  Smoking Status Former   Types: Cigars  Smokeless  Tobacco Never   Tobacco Cessation:  N/A, patient does not currently use tobacco products   Blood Alcohol level:  Lab Results  Component Value Date   ETH <10 06/07/2022   ETH <5 40/98/1191    Metabolic Disorder Labs:  Lab Results  Component Value Date   HGBA1C 5.1 06/07/2022   MPG 99.67 06/07/2022   Lab Results  Component Value Date   PROLACTIN 9.0 06/07/2022   Lab Results  Component Value Date   CHOL 154 06/07/2022   TRIG 88 06/07/2022   HDL 36 (L) 06/07/2022   CHOLHDL 4.3 06/07/2022   VLDL 18 06/07/2022   LDLCALC 100 (H) 06/07/2022    See Psychiatric Specialty Exam and Suicide Risk Assessment completed by Attending Physician prior to discharge.  Discharge destination:  Home  Is patient on multiple antipsychotic therapies at discharge:  No   Has Patient had three or more failed trials of antipsychotic monotherapy by history:  No  Recommended Plan for Multiple Antipsychotic Therapies: NA  Discharge Instructions     Diet - low sodium heart healthy   Complete by: As directed    Increase activity slowly   Complete by: As directed    Increase activity  slowly   Complete by: As directed       Allergies as of 06/12/2022       Reactions   Pork-derived Products Other (See Comments)   Patient preference        Medication List     STOP taking these medications    divalproex 250 MG 24 hr tablet Commonly known as: DEPAKOTE ER   hydrOXYzine 25 MG tablet Commonly known as: ATARAX   sertraline 50 MG tablet Commonly known as: ZOLOFT   traZODone 150 MG tablet Commonly known as: [place] Follow up on 06/12/2022.   Specialty: Behavioral Health Why: You have an appointment on 06/12/22 at 2:30 pm for medication management services.  For therapy services, please go for an assessment, which is done on Monday through Friday, arrive by 7:20 am. Contact information: [place]                Follow-up recommendations:  Activity:  As tolerated Diet:  Regular diet, heart healthy no added salt  Comments:   Activity: as tolerated  Diet: heart healthy  Other: -Follow-up with your outpatient psychiatric provider -instructions on appointment date, time, and address (location) are provided to you in discharge paperwork.  -Take your psychiatric medications as prescribed at discharge - instructions are provided to you in the discharge paperwork  -Follow-up with outpatient primary care doctor and other specialists -for management of preventative medicine and chronic medical disease  -Testing: Follow-up with outpatient provider for lab results: 06-12-22 [facility] level: 55   -Recommend abstinence from alcohol, tobacco, and other illicit drug use at discharge.   -If your psychiatric symptoms recur, worsen, or if you have side effects to your psychiatric medications, call your outpatient psychiatric provider, 911, [phone] or go to the nearest emergency department.  -If suicidal thoughts recur, call your outpatient  psychiatric provider, 911, 988 or go to the nearest emergency department.   Signed: Laretta Bolster, FNP 06/12/2022, 6:12 PM  Total Time Spent in Direct Patient Care:  I personally spent 35 minutes on the unit in direct patient care. The direct patient care time included face-to-face time with the patient,  reviewing the patient's chart, communicating with other professionals, and coordinating care. Greater than 50% of this time was spent in counseling or coordinating care with the patient regarding goals of hospitalization, psycho-education, and discharge planning needs.  On my assessment the patient denied SI, HI, AVH, paranoia, ideas of reference, or first rank symptoms on day of discharge. Patient denied drug cravings or active signs of withdrawal. Patient denied medication side-effects. Patient was not deemed to be a danger to self or others on day of discharge and was in agreement with discharge plans.   I have independently evaluated the patient during a face-to-face assessment on the day of discharge. I reviewed the patient's chart, and I participated in key portions of the service. I discussed the case with the APP, and I agree with the assessment and plan of care as documented in the APP's note, as addended by me or notated below:  I directly edited the note, as above.   Phineas Inches, MD Psychiatrist
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
## 2022-06-12 NOTE — Discharge Instructions (Addendum)
-  Follow-up with your outpatient psychiatric provider -instructions on appointment date, time, and address (location) are provided to you in discharge paperwork.  -Take your psychiatric medications as prescribed at discharge - instructions are provided to you in the discharge paperwork  VA level on 06-12-22 is: 35   -Follow-up with outpatient primary care doctor and other specialists -for management of preventative medicine and any chronic medical disease.  -Recommend abstinence from alcohol, tobacco, and other illicit drug use at discharge.   -If your psychiatric symptoms recur, worsen, or if you have side effects to your psychiatric medications, call your outpatient psychiatric provider, 911, [phone] or go to the nearest emergency department.  -If suicidal thoughts occur, call your outpatient psychiatric provider, 911, [phone] or go to the nearest emergency department.  Naloxone (Narcan) can help reverse an overdose when given to the victim quickly.  [facility] offers free naloxone kits and instructions/training on its use.  Add naloxone to your first aid kit and you can help save a life.   Pick up your free kit at the following locations:   [place]:  [place], [place] [address] [phone]) [facility] [place] [address] [phone]) [facility] [place]  [place]: [place] [address] [place] [address] [phone]) [facility] [place] [address] [phone])

## 2022-06-12 NOTE — Progress Notes (Signed)
  [facility] Adult Case Management Discharge Plan :  Will you be returning to the same living situation after discharge:  Yes,  -[name] ([facility]) [phone] At discharge, do you have transportation home?: Yes,  [name] Do you have the ability to pay for your medications: No. Uninsured  Release of information consent forms completed and in the chart;  Patient's signature needed at discharge.  Patient to Follow up at:  [facility] Follow up on 06/12/2022.   Specialty: Behavioral Health Why: You have an appointment on 06/12/22 at 2:30 pm for medication management services.  For therapy services, please go for an assessment, which is done on Monday through Friday, arrive by 7:20 am. Contact information: [place] [phone]                Next level of care provider has access to [place] and Suicide Prevention discussed: Yes,  [name]     Has patient been referred to the Quitline?: N/A patient is not a smoker  Patient has been referred for addiction treatment: Pt. refused referral Patient to continue working towards treatment goals after discharge. Patient no longer meets criteria for inpatient criteria per attending physician. Continue taking medications as prescribed, nursing to provide instructions at discharge. Follow up with all scheduled appointments.   Miranda Garber S Aveena Bari, LCSW 06/12/2022, 9:46 AM

## 2022-06-12 NOTE — Progress Notes (Signed)
BH MD/PA/NP OP Progress Note  Virtual Visit via Video Note  I connected with Dylan [name] on 06/12/22 at  2:30 PM EST by a video enabled telemedicine application and verified that I am speaking with the correct person using two identifiers.  Location: Patient: Home Provider: Clinic   I discussed the limitations of evaluation and management by telemedicine and the availability of in person appointments. The patient expressed understanding and agreed to proceed.  I provided 30 minutes of non-face-to-face time during this encounter.     06/12/2022 2:59 PM Dylan [name]  MRN:  [id number]  Chief Complaint: "I was without my medications for 6 weeks"    HPI: 47 year old male seen today for follow up  psychiatric evaluation. He  was recently seen [facility] on 06/08/22 through 06/12/2022 for worsening depression and SI after being without his medications for 6 weeks. He has psychiatric history of bipolar 1, PTSD, and cannabis use.  He is currently being managed on hydroxyzine 50 mg nightly as needed, Zoloft 50 mg daily,  and Depakote 750 mg daily. He reports that his medications are effective in managing his psychiatric conditions.    Today he logged in virtually but his microphone was muted. Patient unable to un-mute phone and the rest of his visit was conducted on the phone (over 75 percent of visit). Today patient was pleasant, cooperative, and engaged in conversation.  He informed Probation officer that he was without his medications because he was unable to afford it. He notes that he was laid off at his job and worries about finding a new job and finances. Today provider conducted a GAD 7 and patient scored a 17, at his last visit he scored a 6. Provider also conducted a PHQ 9 and patient scored a 22, at his last visit he scored a 7. He notes that his sleep was poor while hospitalized noting he sleep 2-3 hours nightly. Today he denies SI/HI/VAH. He does note that he has been irritable, distractible, has racing  thoughts and fluctuations in mood.  Today hydroxyzine 50 mg nightly to 50 mg three times daily to help manage sleep and anxiety.  No other medications changes made today. Patient agreeable to continue medications as prescribed.  He will follow-up with outpatient counselor for therapy.  No other concerns noted at this time.    Visit Diagnosis:    ICD-10-CM   1. Bipolar I disorder, most recent episode depressed (HCC)  F31.30 divalproex (DEPAKOTE ER) 250 MG 24 hr tablet    sertraline (ZOLOFT) 50 MG tablet    2. Posttraumatic stress disorder  F43.10 sertraline (ZOLOFT) 50 MG tablet    3. GAD (generalized anxiety disorder)  F41.1 hydrOXYzine (ATARAX) 50 MG tablet    sertraline (ZOLOFT) 50 MG tablet       Past Psychiatric History: bipolar 1, PTSD, and cannabis use. Past Medical History:  Past Medical History:  Diagnosis Date   Bipolar 1 disorder ([place])    Medical history non-contributory    PTSD (post-traumatic stress disorder)     Past Surgical History:  Procedure Laterality Date   NO PAST SURGERIES      Family Psychiatric History: Son autism  Family History: No family history on file.  Social History:  Social History   Socioeconomic History   Marital status: Single    Spouse name: Not on file   Number of children: Not on file   Years of education: Not on file   Highest education level: Not on file  Occupational  History   Not on file  Tobacco Use   Smoking status: Former    Types: Cigars   Smokeless tobacco: Never  Vaping Use   Vaping Use: Never used  Substance and Sexual Activity   Alcohol use: Not Currently    Comment: 3 drinks per month   Drug use: Yes    Types: Marijuana   Sexual activity: Yes    Partners: Female    Comment: fiance  Other Topics Concern   Not on file  Social History Narrative   Not on file   Social Determinants of Health   Financial Resource Strain: Low Risk  (08/19/2020)   Overall Financial Resource Strain (CARDIA)    Difficulty of  Paying Living Expenses: Not very hard  Food Insecurity: No Food Insecurity (06/08/2022)   Hunger Vital Sign    Worried About Running Out of Food in the Last Year: Never true    Ran Out of Food in the Last Year: Never true  Transportation Needs: No Transportation Needs (06/08/2022)   PRAPARE - Administrator, Civil Service (Medical): No    Lack of Transportation (Non-Medical): No  Physical Activity: Sufficiently Active (08/19/2020)   Exercise Vital Sign    Days of Exercise per Week: 7 days    Minutes of Exercise per Session: 50 min  Stress: No Stress Concern Present (09/22/2020)   [employer] of Occupational Health - Occupational Stress Questionnaire    Feeling of Stress : Only a little  Recent Concern: Stress - Stress Concern Present (08/19/2020)   [employer] of Occupational Health - Occupational Stress Questionnaire    Feeling of Stress : Rather much  Social Connections: Moderately Isolated (12/15/2020)   Social Connection and Isolation Panel [NHANES]    Frequency of Communication with Friends and Family: Twice a week    Frequency of Social Gatherings with Friends and Family: Three times a week    Attends Religious Services: Never    Active Member of Clubs or Organizations: No    Attends Banker Meetings: Never    Marital Status: Living with partner    Allergies:  Allergies  Allergen Reactions   Pork-Derived Products Other (See Comments)    Patient preference    Metabolic Disorder Labs: Lab Results  Component Value Date   HGBA1C 5.1 06/07/2022   MPG 99.67 06/07/2022   Lab Results  Component Value Date   PROLACTIN 9.0 06/07/2022   Lab Results  Component Value Date   CHOL 154 06/07/2022   TRIG 88 06/07/2022   HDL 36 (L) 06/07/2022   CHOLHDL 4.3 06/07/2022   VLDL 18 06/07/2022   LDLCALC 100 (H) 06/07/2022   Lab Results  Component Value Date   TSH 0.774 06/07/2022   TSH 2.422 04/24/2015    Therapeutic Level Labs: No results found  for: "LITHIUM" Lab Results  Component Value Date   VALPROATE 57 06/12/2022   VALPROATE <10 (L) 06/07/2022   No results found for: "CBMZ"  Current Medications: Current Outpatient Medications  Medication Sig Dispense Refill   divalproex (DEPAKOTE ER) 250 MG 24 hr tablet Take 3 tablets (750 mg total) by mouth at bedtime. 90 tablet 3   hydrOXYzine (ATARAX) 50 MG tablet Take 1 tablet (50 mg total) by mouth 3 (three) times daily as needed. 90 tablet 3   sertraline (ZOLOFT) 50 MG tablet Take 1 tablet (50 mg total) by mouth daily. 30 tablet 3   No current facility-administered medications for this visit.  Musculoskeletal: Strength & Muscle Tone: within normal limits and Unable camera off and phobe muted telephone visit conducted Gait & Station: normal,  Unable camera off and phobe muted telephone visit conducted Patient leans: N/A  Psychiatric Specialty Exam: Review of Systems  There were no vitals taken for this visit.There is no height or weight on file to calculate BMI.  General Appearance:  Unable camera off and phobe muted telephone visit conducted  Eye Contact:    Unable camera off and phobe muted telephone visit conducted  Speech:  Clear and Coherent and Normal Rate  Volume:  Normal  Mood:    Unable camera off and phobe muted telephone visit conducted  Affect:  Appropriate and Congruent  Thought Process:  Coherent, Goal Directed and Linear  Orientation:  Full (Time, Place, and Person)  Thought Content: WDL and Logical   Suicidal Thoughts:  Yes.  without intent/plan  Homicidal Thoughts:  No  Memory:  Immediate;   Good Recent;   Good Remote;   Good  Judgement:  Good  Insight:  Good  Psychomotor Activity:    Unable camera off and phobe muted telephone visit conducted  Concentration:  Concentration: Good and Attention Span: Good  Recall:  Good  Fund of Knowledge: Good  Language: Good  Akathisia:    Unable camera off and phobe muted telephone visit conducted  Handed:   Right  AIMS (if indicated): Good  Assets:  Communication Skills Desire for Improvement Financial Resources/Insurance Housing Intimacy Social Support  ADL's:  Intact  Cognition: WNL  Sleep:  Poor   Screenings: AIMS    Flowsheet Row Admission (Discharged) from 04/21/2015 in [facility]  AIMS Total Score 0      AUDIT    Flowsheet Row Admission (Discharged) from 06/08/2022 in [facility]  Alcohol Use Disorder Identification Test Final Score (AUDIT) 1      GAD-7    Flowsheet Row Video Visit from 06/12/2022 in [facility] Office Visit from 01/31/2022 in [facility] Office Visit from 10/31/2021 in [facility] from 05/25/2021 in [facility] from 02/22/2021 in [facility]  Total GAD-7 Score 17 6 4 5 3       PHQ2-9    Flowsheet Row Video Visit from 06/12/2022 in [facility] Office Visit from 01/31/2022 in [facility] Office Visit from 10/31/2021 in [facility] from 05/25/2021 in [facility] from 02/22/2021 in [facility]  PHQ-2 Total Score 6 2 1 2 2   PHQ-9 Total Score 22 7 4 7 5       Flowsheet Row Video Visit from 06/12/2022 in [facility] Admission (Discharged) from 06/08/2022 in [facility] ED from 06/07/2022 in [facility] Error: Q7 should not be populated when Q6 is No Low Risk Moderate Risk        Assessment and Plan: Patient reports that he has been anxious, depressed, has poor sleep, and has symptoms of hypomania. Patient released from [facility] today. Today  hydroxyzine 50 mg nightly to 50 mg three times daily to help manage sleep and anxiety.  No other medications changes made today. Patient agreeable to continue medications as prescribed.  He will follow-up with outpatient counselor for therapy.  1. Bipolar I disorder, most recent episode depressed (HCC)  Continue- divalproex (DEPAKOTE ER) 250 MG 24 hr tablet; Take 3 tablets (750 mg total) by mouth at bedtime.  Dispense: 90 tablet; Refill: 3 Continue- sertraline (ZOLOFT) 50 MG tablet; Take 1 tablet (50 mg total) by mouth daily.  Dispense: 30 tablet; Refill: 3  2. Posttraumatic stress disorder  Continue- sertraline (ZOLOFT) 50 MG tablet; Take 1 tablet (50 mg total) by mouth daily.  Dispense: 30 tablet; Refill: 3  3. GAD (generalized anxiety disorder)  Increased- hydrOXYzine (ATARAX) 50 MG tablet; Take 1 tablet (50 mg total) by mouth 3 (three) times daily as needed.  Dispense: 90 tablet; Refill: 3 Continue- sertraline (ZOLOFT) 50 MG tablet; Take 1 tablet (50 mg total) by mouth daily.  Dispense: 30 tablet; Refill: 3    Follow-up in 3 months Follow-up with therapy   Shanna Cisco, NP 06/12/2022, 2:59 PM

## 2022-06-12 NOTE — Progress Notes (Addendum)
Discharge Note: Patient discharged home. Patient denied SI and HI. Denied A/V hallucinations. Suicide prevention information given and discussed with patient who stated they understood and had no questions. Patient stated they received all their belongings, clothing, toiletries, misc items, etc. Patient stated they appreciated all assistance received from [facility] staff. All required discharge information given to patient. 

## 2022-06-19 ENCOUNTER — Other Ambulatory Visit: Payer: Self-pay

## 2022-08-09 ENCOUNTER — Other Ambulatory Visit: Payer: Self-pay

## 2022-08-09 ENCOUNTER — Encounter (HOSPITAL_COMMUNITY): Payer: Self-pay | Admitting: Psychiatry

## 2022-08-09 ENCOUNTER — Telehealth (INDEPENDENT_AMBULATORY_CARE_PROVIDER_SITE_OTHER): Payer: Commercial Managed Care - HMO | Admitting: Psychiatry

## 2022-08-09 DIAGNOSIS — F3130 Bipolar disorder, current episode depressed, mild or moderate severity, unspecified: Secondary | ICD-10-CM

## 2022-08-09 DIAGNOSIS — F411 Generalized anxiety disorder: Secondary | ICD-10-CM | POA: Diagnosis not present

## 2022-08-09 DIAGNOSIS — F4310 Post-traumatic stress disorder, unspecified: Secondary | ICD-10-CM | POA: Diagnosis not present

## 2022-08-09 MED ORDER — HYDROXYZINE HCL 50 MG PO TABS: 50.0000 mg | ORAL_TABLET | Freq: Three times a day (TID) | ORAL | 3 refills | Status: AC | PRN

## 2022-08-09 MED ORDER — DIVALPROEX SODIUM ER 250 MG PO TB24: 750.0000 mg | ORAL_TABLET | Freq: Every day | ORAL | 3 refills | Status: AC

## 2022-08-09 MED ORDER — SERTRALINE HCL 50 MG PO TABS
50.0000 mg | ORAL_TABLET | Freq: Every day | ORAL | 3 refills | Status: AC
  Filled 2022-08-09: qty 30, 30d supply, fill #0

## 2022-08-09 NOTE — Progress Notes (Signed)
[facility] MD/PA/NP OP Progress Note  Virtual Visit via Video Note  I connected with Dylan [name] on 08/09/22 at  8:30 AM EDT by a video enabled telemedicine application and verified that I am speaking with the correct person using two identifiers.  Location: [name]: Home Provider: Clinic   I discussed the limitations of evaluation and management by telemedicine and the availability of in person appointments. The [name] expressed understanding and agreed to proceed.  I provided 30 minutes of non-face-to-face time during this encounter.     08/09/2022 8:42 AM Dylan [name]  MRN:  [id number]  Chief Complaint: "Everything thing is fine"    HPI: 47 year old male seen today for follow up  psychiatric evaluation. He has psychiatric history of bipolar 1, PTSD, and cannabis use.  He is currently being managed on hydroxyzine 50 mg nightly as needed, Zoloft 50 mg daily,  and Depakote 750 mg daily. He reports that his medications are effective in managing his psychiatric conditions.    Today logged in virtually but had difficulty staying on the line.  Provider encouraged [name] to come in at his next visit.  He endorsed understanding and agreed.  During exam he was pleasant, cooperative, and engaged in conversation.  He informed Probation officer that everything is going fine.  He notes that recently he got a new position at [employer].  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  He informed Probation officer that he is somewhat nervous about his new position which he starts on Monday but is excited about it.  [name] also informed Probation officer that his fiance and son is doing well.  He informed Probation officer that his lawyer is in need of documentation saying that he cannot work.  Provider informed [name] that such a letter could not be written as [name] has maintained a job and is currently pursuing another.  [name] does note that it challenging at times to work due to his mental health.  [name] prefers to work alone to avoid conflict  with his peers and increased irritability..  Provider informed [name] that the letter states that at times [name] presents with challenges due to his mental health at work could be written.  He endorsed understanding and agreed.    Today provider conducted a GAD-7 and [name] scored a 9, at his last visit he scored a 17.  Provider also conducted PHQ-9 and [name] scored a 9, at his last visit he scored a 22.  He endorses adequate sleep and appetite.  Today he denies SI/HI/VH, mania, paranoia.   [name] informed writer that he has some shoulder pain which she notes started a few weeks ago.  He quantifies his pain as 3 out of 10 and notes that he takes Advil to alleviate it.   No medication changes made today.  [name] agreeable to taking medications as prescribed.  No other concerns noted at this time.     Visit Diagnosis:    ICD-10-CM   1. Bipolar I disorder, most recent episode depressed (HCC)  F31.30 divalproex (DEPAKOTE ER) 250 MG 24 hr tablet    sertraline (ZOLOFT) 50 MG tablet    2. GAD (generalized anxiety disorder)  F41.1 hydrOXYzine (ATARAX) 50 MG tablet    sertraline (ZOLOFT) 50 MG tablet    3. Posttraumatic stress disorder  F43.10 sertraline (ZOLOFT) 50 MG tablet       Past Psychiatric History: bipolar 1, PTSD, and cannabis use. Past Medical History:  Past Medical History:  Diagnosis Date   Bipolar 1 disorder (  [place])    Medical history non-contributory    PTSD (post-traumatic stress disorder)     Past Surgical History:  Procedure Laterality Date   NO PAST SURGERIES      Family Psychiatric History: Son autism  Family History: No family history on file.  Social History:  Social History   Socioeconomic History   Marital status: Single    Spouse name: Not on file   Number of children: Not on file   Years of education: Not on file   Highest education level: Not on file  Occupational History   Not on file  Tobacco Use   Smoking status: Former    Types: Cigars    Smokeless tobacco: Never  Vaping Use   Vaping Use: Never used  Substance and Sexual Activity   Alcohol use: Not Currently    Comment: 3 drinks per month   Drug use: Yes    Types: Marijuana   Sexual activity: Yes    Partners: Female    Comment: fiance  Other Topics Concern   Not on file  Social History Narrative   Not on file   Social Determinants of Health   Financial Resource Strain: Low Risk  (08/19/2020)   Overall Financial Resource Strain (CARDIA)    Difficulty of Paying Living Expenses: Not very hard  Food Insecurity: No Food Insecurity (06/08/2022)   Hunger Vital Sign    Worried About Running Out of Food in the Last Year: Never true    Ran Out of Food in the Last Year: Never true  Transportation Needs: No Transportation Needs (06/08/2022)   PRAPARE - Hydrologist (Medical): No    Lack of Transportation (Non-Medical): No  Physical Activity: Sufficiently Active (08/19/2020)   Exercise Vital Sign    Days of Exercise per Week: 7 days    Minutes of Exercise per Session: 50 min  Stress: No Stress Concern Present (09/22/2020)   [place]    Feeling of Stress : Only a little  Recent Concern: Stress - Stress Concern Present (08/19/2020)   [place]    Feeling of Stress : Rather much  Social Connections: Moderately Isolated (12/15/2020)   Social Connection and Isolation Panel [NHANES]    Frequency of Communication with Friends and Family: Twice a week    Frequency of Social Gatherings with Friends and Family: Three times a week    Attends Religious Services: Never    Active Member of Clubs or Organizations: No    Attends Archivist Meetings: Never    Marital Status: Living with partner    Allergies:  Allergies  Allergen Reactions   Pork-Derived Products Other (See Comments)    [name] preference     Metabolic Disorder Labs: Lab Results  Component Value Date   HGBA1C 5.1 06/07/2022   MPG 99.67 06/07/2022   Lab Results  Component Value Date   PROLACTIN 9.0 06/07/2022   Lab Results  Component Value Date   CHOL 154 06/07/2022   TRIG 88 06/07/2022   HDL 36 (L) 06/07/2022   CHOLHDL 4.3 06/07/2022   VLDL 18 06/07/2022   LDLCALC 100 (H) 06/07/2022   Lab Results  Component Value Date   TSH 0.774 06/07/2022   TSH 2.422 04/24/2015    Therapeutic Level Labs: No results found for: "LITHIUM" Lab Results  Component Value Date   VALPROATE 57 06/12/2022   VALPROATE <10 (L)  06/07/2022   No results found for: "CBMZ"  Current Medications: Current Outpatient Medications  Medication Sig Dispense Refill   divalproex (DEPAKOTE ER) 250 MG 24 hr tablet Take 3 tablets (750 mg total) by mouth at bedtime. 90 tablet 3   hydrOXYzine (ATARAX) 50 MG tablet Take 1 tablet (50 mg total) by mouth 3 (three) times daily as needed. 90 tablet 3   sertraline (ZOLOFT) 50 MG tablet Take 1 tablet (50 mg total) by mouth daily. 30 tablet 3   No current facility-administered medications for this visit.     Musculoskeletal: Strength & Muscle Tone: within normal limits and telehealth visit Gait & Station: normal, telehealth visit [name] leans: N/A  Psychiatric Specialty Exam: Review of Systems  There were no vitals taken for this visit.There is no height or weight on file to calculate BMI.  General Appearance: Well-groomed  Eye Contact:  Good  Speech:  Clear and Coherent and Normal Rate  Volume:  Normal  Mood:  Euthymic and  Unable camera off and phobe muted telephone visit conducted  Affect:  Appropriate and Congruent  Thought Process:  Coherent, Goal Directed and Linear  Orientation:  Full (Time, Place, and Person)  Thought Content: WDL and Logical   Suicidal Thoughts:  No  Homicidal Thoughts:  No  Memory:  Immediate;   Good Recent;   Good Remote;   Good  Judgement:  Good  Insight:   Good  Psychomotor Activity:  Normal  Concentration:  Concentration: Good and Attention Span: Good  Recall:  Good  Fund of Knowledge: Good  Language: Good  Akathisia:  No  Handed:  Right  AIMS (if indicated): Good  Assets:  Communication Skills Desire for Improvement Financial Resources/Insurance Housing Intimacy Social Support  ADL's:  Intact  Cognition: WNL  Sleep:  Good   Screenings: AIMS    Flowsheet Row Admission (Discharged) from 04/21/2015 in [facility]  AIMS Total Score 0      AUDIT    Flowsheet Row Admission (Discharged) from 06/08/2022 in [place] [facility]  Alcohol Use Disorder Identification Test Final Score (AUDIT) 1      GAD-7    Flowsheet Row Video Visit from 08/09/2022 in [facility] Video Visit from 06/12/2022 in [facility] Office Visit from 01/31/2022 in [facility] Office Visit from 10/31/2021 in [facility] from 05/25/2021 in [facility]  Total GAD-7 Score 9 17 6 4 5       PHQ2-9    Flowsheet Row Video Visit from 08/09/2022 in [facility] Video Visit from 06/12/2022 in [facility] Office Visit from 01/31/2022 in [facility] Office Visit from 10/31/2021 in [place] from 05/25/2021 in [facility]  PHQ-2 Total Score 4 6 2 1 2   PHQ-9 Total Score 9 22 7 4 7       Flowsheet Row Video Visit from 08/09/2022 in [facility] Video Visit from 06/12/2022 in [facility] Admission (Discharged) from 06/08/2022 in [facility]  C-SSRS RISK CATEGORY Error: Q3, 4, or 5 should not be populated when Q2 is No  Error: Q7 should not be populated when Q6 is No Low Risk        Assessment and Plan: [name] reports that his anxiety, depression, and  sleep has improved since his last visit.  No medication changes made today.  [name] agreeable to continue medication as prescribed 1. Bipolar I disorder, most recent episode depressed (HCC)  Continue- divalproex (DEPAKOTE ER) 250 MG 24 hr tablet; Take 3 tablets (750 mg total) by mouth at bedtime.  Dispense: 90 tablet; Refill: 3 Continue- sertraline (ZOLOFT) 50 MG tablet; Take 1 tablet (50 mg total) by mouth daily.  Dispense: 30 tablet; Refill: 3  2. Posttraumatic stress disorder  Continue- sertraline (ZOLOFT) 50 MG tablet; Take 1 tablet (50 mg total) by mouth daily.  Dispense: 30 tablet; Refill: 3  3. GAD (generalized anxiety disorder)  Continue- hydrOXYzine (ATARAX) 50 MG tablet; Take 1 tablet (50 mg total) by mouth 3 (three) times daily as needed.  Dispense: 90 tablet; Refill: 3 Continue- sertraline (ZOLOFT) 50 MG tablet; Take 1 tablet (50 mg total) by mouth daily.  Dispense: 30 tablet; Refill: 3    Follow-up in 3 months Follow-up with therapy   Salley Slaughter, NP 08/09/2022, 8:42 AM

## 2022-08-16 ENCOUNTER — Other Ambulatory Visit: Payer: Self-pay

## 2022-11-09 ENCOUNTER — Encounter (HOSPITAL_COMMUNITY): Payer: Self-pay

## 2022-11-09 ENCOUNTER — Telehealth (HOSPITAL_COMMUNITY): Payer: Commercial Managed Care - HMO | Admitting: Psychiatry
